# Patient Record
Sex: Female | Born: 1965 | Race: White | Hispanic: No | Marital: Married | Smoking: Never smoker
Health system: Southern US, Community
[De-identification: ages and names within clinical notes are randomized; demographics above are authoritative.]

## PROBLEM LIST (undated history)

## (undated) DIAGNOSIS — R030 Elevated blood-pressure reading, without diagnosis of hypertension: Secondary | ICD-10-CM

## (undated) DIAGNOSIS — F329 Major depressive disorder, single episode, unspecified: Secondary | ICD-10-CM

## (undated) DIAGNOSIS — F419 Anxiety disorder, unspecified: Secondary | ICD-10-CM

## (undated) DIAGNOSIS — F32A Depression, unspecified: Secondary | ICD-10-CM

## (undated) DIAGNOSIS — K219 Gastro-esophageal reflux disease without esophagitis: Secondary | ICD-10-CM

## (undated) DIAGNOSIS — G5760 Lesion of plantar nerve, unspecified lower limb: Secondary | ICD-10-CM

## (undated) DIAGNOSIS — E785 Hyperlipidemia, unspecified: Secondary | ICD-10-CM

## (undated) DIAGNOSIS — R002 Palpitations: Secondary | ICD-10-CM

## (undated) DIAGNOSIS — IMO0001 Reserved for inherently not codable concepts without codable children: Secondary | ICD-10-CM

## (undated) DIAGNOSIS — E291 Testicular hypofunction: Secondary | ICD-10-CM

## (undated) DIAGNOSIS — E538 Deficiency of other specified B group vitamins: Secondary | ICD-10-CM

## (undated) HISTORY — DX: Gastro-esophageal reflux disease without esophagitis: K21.9

## (undated) HISTORY — DX: Major depressive disorder, single episode, unspecified: F32.9

## (undated) HISTORY — DX: Lesion of plantar nerve, unspecified lower limb: G57.60

## (undated) HISTORY — DX: Elevated blood-pressure reading, without diagnosis of hypertension: R03.0

## (undated) HISTORY — DX: Deficiency of other specified B group vitamins: E53.8

## (undated) HISTORY — DX: Palpitations: R00.2

## (undated) HISTORY — DX: Hyperlipidemia, unspecified: E78.5

## (undated) HISTORY — DX: Testicular hypofunction: E29.1

## (undated) HISTORY — DX: Anxiety disorder, unspecified: F41.9

## (undated) HISTORY — DX: Reserved for inherently not codable concepts without codable children: IMO0001

## (undated) HISTORY — DX: Depression, unspecified: F32.A

## (undated) HISTORY — PX: TUBAL LIGATION: SHX77

---

## 2002-06-19 ENCOUNTER — Other Ambulatory Visit: Admission: RE | Admit: 2002-06-19 | Discharge: 2002-06-19 | Payer: Self-pay | Admitting: Obstetrics and Gynecology

## 2003-07-21 ENCOUNTER — Other Ambulatory Visit: Admission: RE | Admit: 2003-07-21 | Discharge: 2003-07-21 | Payer: Self-pay | Admitting: Obstetrics and Gynecology

## 2004-09-02 ENCOUNTER — Other Ambulatory Visit: Admission: RE | Admit: 2004-09-02 | Discharge: 2004-09-02 | Payer: Self-pay | Admitting: Obstetrics and Gynecology

## 2007-11-03 LAB — CONVERTED CEMR LAB: Pap Smear: NORMAL

## 2008-05-20 ENCOUNTER — Ambulatory Visit: Payer: Self-pay | Admitting: Internal Medicine

## 2008-05-20 DIAGNOSIS — H538 Other visual disturbances: Secondary | ICD-10-CM | POA: Insufficient documentation

## 2008-05-20 DIAGNOSIS — I499 Cardiac arrhythmia, unspecified: Secondary | ICD-10-CM | POA: Insufficient documentation

## 2008-05-20 DIAGNOSIS — Z9189 Other specified personal risk factors, not elsewhere classified: Secondary | ICD-10-CM | POA: Insufficient documentation

## 2008-05-20 DIAGNOSIS — E291 Testicular hypofunction: Secondary | ICD-10-CM | POA: Insufficient documentation

## 2008-05-20 DIAGNOSIS — R209 Unspecified disturbances of skin sensation: Secondary | ICD-10-CM | POA: Insufficient documentation

## 2008-08-24 ENCOUNTER — Ambulatory Visit: Payer: Self-pay | Admitting: Internal Medicine

## 2008-08-24 DIAGNOSIS — F32A Depression, unspecified: Secondary | ICD-10-CM | POA: Insufficient documentation

## 2008-08-24 DIAGNOSIS — E785 Hyperlipidemia, unspecified: Secondary | ICD-10-CM | POA: Insufficient documentation

## 2008-08-24 DIAGNOSIS — K219 Gastro-esophageal reflux disease without esophagitis: Secondary | ICD-10-CM | POA: Insufficient documentation

## 2008-08-24 DIAGNOSIS — R5381 Other malaise: Secondary | ICD-10-CM | POA: Insufficient documentation

## 2008-08-24 DIAGNOSIS — R5383 Other fatigue: Secondary | ICD-10-CM

## 2008-08-24 DIAGNOSIS — F329 Major depressive disorder, single episode, unspecified: Secondary | ICD-10-CM

## 2008-08-24 LAB — CONVERTED CEMR LAB
BUN: 15 mg/dL (ref 6–23)
Basophils Absolute: 0 10*3/uL (ref 0.0–0.1)
Basophils Relative: 0.2 % (ref 0.0–3.0)
CO2: 30 meq/L (ref 19–32)
Calcium: 9.2 mg/dL (ref 8.4–10.5)
Chloride: 108 meq/L (ref 96–112)
Cholesterol: 162 mg/dL (ref 0–200)
Creatinine, Ser: 0.8 mg/dL (ref 0.4–1.2)
Eosinophils Absolute: 0.1 10*3/uL (ref 0.0–0.7)
Eosinophils Relative: 1.9 % (ref 0.0–5.0)
GFR calc non Af Amer: 82.64 mL/min (ref >60)
Glucose, Bld: 94 mg/dL (ref 70–99)
HCT: 39.9 % (ref 36.0–46.0)
HDL: 32.5 mg/dL — ABNORMAL LOW (ref >39.00)
Hemoglobin: 14.1 g/dL (ref 12.0–15.0)
LDL Cholesterol: 109 mg/dL — ABNORMAL HIGH (ref 0–99)
Lymphocytes Relative: 27.3 % (ref 12.0–46.0)
Lymphs Abs: 1.4 10*3/uL (ref 0.7–4.0)
MCHC: 35.4 g/dL (ref 30.0–36.0)
MCV: 89.5 fL (ref 78.0–100.0)
Monocytes Absolute: 0.4 10*3/uL (ref 0.1–1.0)
Monocytes Relative: 8.6 % (ref 3.0–12.0)
Neutro Abs: 3.2 10*3/uL (ref 1.4–7.7)
Neutrophils Relative %: 62 % (ref 43.0–77.0)
Platelets: 209 10*3/uL (ref 150.0–400.0)
Potassium: 3.9 meq/L (ref 3.5–5.1)
RBC: 4.45 M/uL (ref 3.87–5.11)
RDW: 12 % (ref 11.5–14.6)
Sodium: 141 meq/L (ref 135–145)
TSH: 1.85 microintl units/mL (ref 0.35–5.50)
Total CHOL/HDL Ratio: 5
Triglycerides: 102 mg/dL (ref 0.0–149.0)
VLDL: 20.4 mg/dL (ref 0.0–40.0)
WBC: 5.1 10*3/uL (ref 4.5–10.5)

## 2008-08-25 ENCOUNTER — Telehealth: Payer: Self-pay | Admitting: Internal Medicine

## 2009-11-09 ENCOUNTER — Ambulatory Visit: Payer: Self-pay | Admitting: Internal Medicine

## 2009-11-09 LAB — CONVERTED CEMR LAB
ALT: 20 units/L (ref 0–35)
AST: 17 units/L (ref 0–37)
Albumin: 4.1 g/dL (ref 3.5–5.2)
Alkaline Phosphatase: 55 units/L (ref 39–117)
BUN: 12 mg/dL (ref 6–23)
Basophils Absolute: 0 10*3/uL (ref 0.0–0.1)
Basophils Relative: 0.6 % (ref 0.0–3.0)
Bilirubin Urine: NEGATIVE
Bilirubin, Direct: 0.1 mg/dL (ref 0.0–0.3)
CO2: 28 meq/L (ref 19–32)
Calcium: 9.5 mg/dL (ref 8.4–10.5)
Chloride: 106 meq/L (ref 96–112)
Cholesterol: 158 mg/dL (ref 0–200)
Creatinine, Ser: 0.7 mg/dL (ref 0.4–1.2)
Eosinophils Absolute: 0.1 10*3/uL (ref 0.0–0.7)
Eosinophils Relative: 1.7 % (ref 0.0–5.0)
GFR calc non Af Amer: 92.81 mL/min (ref >60)
Glucose, Bld: 83 mg/dL (ref 70–99)
HCT: 38.3 % (ref 36.0–46.0)
HDL: 36.4 mg/dL — ABNORMAL LOW (ref >39.00)
Hemoglobin: 13.3 g/dL (ref 12.0–15.0)
Ketones, ur: NEGATIVE mg/dL
LDL Cholesterol: 102 mg/dL — ABNORMAL HIGH (ref 0–99)
Leukocytes, UA: NEGATIVE
Lymphocytes Relative: 26.7 % (ref 12.0–46.0)
Lymphs Abs: 1.5 10*3/uL (ref 0.7–4.0)
MCHC: 34.5 g/dL (ref 30.0–36.0)
MCV: 91.3 fL (ref 78.0–100.0)
Monocytes Absolute: 0.5 10*3/uL (ref 0.1–1.0)
Monocytes Relative: 8.1 % (ref 3.0–12.0)
Neutro Abs: 3.5 10*3/uL (ref 1.4–7.7)
Neutrophils Relative %: 62.9 % (ref 43.0–77.0)
Nitrite: NEGATIVE
Platelets: 232 10*3/uL (ref 150.0–400.0)
Potassium: 4.2 meq/L (ref 3.5–5.1)
RBC: 4.2 M/uL (ref 3.87–5.11)
RDW: 12.9 % (ref 11.5–14.6)
Sodium: 141 meq/L (ref 135–145)
Specific Gravity, Urine: 1.025 (ref 1.000–1.030)
TSH: 1.85 microintl units/mL (ref 0.35–5.50)
Total Bilirubin: 0.6 mg/dL (ref 0.3–1.2)
Total CHOL/HDL Ratio: 4
Total Protein, Urine: NEGATIVE mg/dL
Total Protein: 6.5 g/dL (ref 6.0–8.3)
Triglycerides: 97 mg/dL (ref 0.0–149.0)
Urine Glucose: NEGATIVE mg/dL
Urobilinogen, UA: 0.2 (ref 0.0–1.0)
VLDL: 19.4 mg/dL (ref 0.0–40.0)
WBC: 5.6 10*3/uL (ref 4.5–10.5)
pH: 6 (ref 5.0–8.0)

## 2009-11-16 ENCOUNTER — Encounter: Payer: Self-pay | Admitting: Internal Medicine

## 2009-11-16 ENCOUNTER — Ambulatory Visit: Payer: Self-pay | Admitting: Internal Medicine

## 2009-11-16 DIAGNOSIS — G5760 Lesion of plantar nerve, unspecified lower limb: Secondary | ICD-10-CM | POA: Insufficient documentation

## 2009-11-16 LAB — HM MAMMOGRAPHY: HM Mammogram: NORMAL

## 2009-12-28 ENCOUNTER — Ambulatory Visit: Payer: Self-pay | Admitting: Internal Medicine

## 2010-01-07 ENCOUNTER — Telehealth: Payer: Self-pay | Admitting: Internal Medicine

## 2010-02-01 NOTE — Assessment & Plan Note (Signed)
Summary: CPX / NWS  #   Vital Signs:  Patient profile:   45 year old female Menstrual status:  regular Height:      68 inches (172.72 cm) Weight:      195.6 pounds (88.91 kg) BMI:     29.85 O2 Sat:      97 % on Room air Temp:     98.4 degrees F (36.89 degrees C) oral Pulse rate:   95 / minute BP sitting:   100 / 76  (left arm) Cuff size:   regular  Vitals Entered By: Orlan Leavens RMA (November 16, 2009 8:17 AM)  O2 Flow:  Room air CC: CPX Is Patient Diabetic? No Pain Assessment Patient in pain? no      Comments Req refill on Lexapro   Primary Care Provider:  Newt Lukes MD  CC:  CPX.  History of Present Illness: patient is here today for annual physical. Patient feels well.   c/o toe numbness/pain on bottom of left foot between great and 2nd toe - only with walking exertion - gone with rest - no injury or swelling  reviewed prior medical issues as well: depression - fatigue symptoms well controlled but experienceing more depressed libido since stopping testosterone injections - ?alt med with less SE  GERD - reports compliance with ongoing medical treatment and no changes in medication dose or frequency. denies adverse side effects related to current therapy.   Preventive Screening-Counseling & Management  Alcohol-Tobacco     Alcohol drinks/day: <1     Smoking Status: never     Tobacco Counseling: not indicated; no tobacco use  Caffeine-Diet-Exercise     Does Patient Exercise: yes     Exercise Counseling: not indicated; exercise is adequate     Depression Counseling: not indicated; screening negative for depression  Safety-Violence-Falls     Seat Belt Counseling: not indicated; patient wears seat belts     Helmet Counseling: not applicable     Violence Counseling: not applicable  Clinical Review Panels:  Prevention   Last Mammogram:  normal (11/03/2007)   Last Pap Smear:  normal (11/03/2007)  Immunizations   Last Tetanus Booster:  Tdap  (11/16/2009)   Last Flu Vaccine:  [facility]  (10/16/2009)  Lipid Management   Cholesterol:  158 (11/09/2009)   LDL (bad choesterol):  102 (11/09/2009)   HDL (good cholesterol):  36.40 (11/09/2009)  CBC   WBC:  5.6 (11/09/2009)   RBC:  4.20 (11/09/2009)   Hgb:  13.3 (11/09/2009)   Hct:  38.3 (11/09/2009)   Platelets:  232.0 (11/09/2009)   MCV  91.3 (11/09/2009)   MCHC  34.5 (11/09/2009)   RDW  12.9 (11/09/2009)   PMN:  62.9 (11/09/2009)   Lymphs:  26.7 (11/09/2009)   Monos:  8.1 (11/09/2009)   Eosinophils:  1.7 (11/09/2009)   Basophil:  0.6 (11/09/2009)  Complete Metabolic Panel   Glucose:  83 (11/09/2009)   Sodium:  141 (11/09/2009)   Potassium:  4.2 (11/09/2009)   Chloride:  106 (11/09/2009)   CO2:  28 (11/09/2009)   BUN:  12 (11/09/2009)   Creatinine:  0.7 (11/09/2009)   Albumin:  4.1 (11/09/2009)   Total Protein:  6.5 (11/09/2009)   Calcium:  9.5 (11/09/2009)   Total Bili:  0.6 (11/09/2009)   Alk Phos:  55 (11/09/2009)   SGPT (ALT):  20 (11/09/2009)   SGOT (AST):  17 (11/09/2009)   Current Medications (verified): 1)  Lexapro 5 Mg Tabs (Escitalopram Oxalate) .... Take 1 By  Mouth Qd 2)  Prilosec Otc 20 Mg Tbec (Omeprazole Magnesium) .... Take 1 By Mouth Once Daily  Allergies (verified): No Known Drug Allergies  Past History:  Past medical, surgical, family and social histories (including risk factors) reviewed, and no changes noted (except as noted below).  Past Medical History: cardiac arrhythmia - negative Holter and cardiac workup -[place] ?2003 history of elevated blood pressure, but no hypertension dx/tx GERD depression  MD roster: gyn - silva  Past Surgical History: Reviewed history from 05/20/2008 and no changes required. none  Family History: Reviewed history from 05/20/2008 and no changes required. Family History Hypertension (parent & grandparent)  Social History: Reviewed history from 05/20/2008 and no changes  required. Never Smoked, occ alcohol married, lives with spouse works as Diplomatic Services operational officer part time Does Patient Exercise:  yes  Review of Systems       see HPI above. I have reviewed all other systems and they were negative.   Physical Exam  General:  slightly overweight, but alert, well-developed, well-nourished, and cooperative to examination.    Head:  Normocephalic and atraumatic without obvious abnormalities. No apparent alopecia or balding. Eyes:  vision grossly intact; pupils equal, round and reactive to light.  conjunctiva and lids normal.    Ears:  normal pinnae bilaterally, without erythema, swelling, or tenderness to palpation. TMs clear, without effusion, or cerumen impaction. Hearing grossly normal bilaterally  Mouth:  teeth and gums in good repair; mucous membranes moist, without lesions or ulcers. oropharynx clear without exudate, no erythema.  Neck:  supple, full ROM, no masses, no thyromegaly; no thyroid nodules or tenderness. no JVD or carotid bruits.   Lungs:  normal respiratory effort, no intercostal retractions or use of accessory muscles; normal breath sounds bilaterally - no crackles and no wheezes.    Heart:  normal rate, regular rhythm, no murmur, and no rub. BLE without edema Abdomen:  soft, non-tender, normal bowel sounds, no distention; no masses and no appreciable hepatomegaly or splenomegaly.   Genitalia:  defer gyn Msk:  No deformity or scoliosis noted of thoracic or lumbar spine.  left foot normal - no reproducible pain with palp or deformity Neurologic:  alert & oriented X3 and cranial nerves II-XII symetrically intact.  strength normal in all extremities, sensation intact to light touch, and gait normal. speech fluent without dysarthria or aphasia; follows commands with good comprehension.  Skin:  no rashes, vesicles, ulcers, or erythema. No nodules or irregularity to palpation.  Psych:  Oriented X3, memory intact for recent and remote, normally interactive, good  eye contact, not anxious appearing, not depressed appearing, and not agitated.      Impression & Recommendations:  Problem # 1:  PREVENTIVE HEALTH CARE (ICD-V70.0) Patient has been counseled on age-appropriate routine health concerns for screening and prevention. These are reviewed and up-to-date. Immunizations are up-to-date or declined. Labs and ECG reviewed.  Orders: EKG w/ Interpretation (93000)  Problem # 2:  GERD (ICD-530.81) desribes occ esoph spasm pain - 1x/mo or less - if increasing spells, consider alt PPI - pt understands and agrees The following medications were removed from the medication list:    Prilosec 10 Mg Cpdr (Omeprazole) .Marland Kitchen... Take 1 by mouth qd Her updated medication list for this problem includes:    Prilosec Otc 20 Mg Tbec (Omeprazole magnesium) .Marland Kitchen... 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 1 by mouth once daily  Orders: Prescription Created Electronically [phone])  Complete Medication List: 1)  Wellbutrin Xl 150 Mg Xr24h-tab (Bupropion hcl) .Marland Kitchen.. 1 by mouth once daily 2)  Prilosec Otc 20 Mg Tbec (Omeprazole magnesium) .... Take 1 by mouth once daily  Other Orders: Tdap => 47yrs IM (09811) Admin 1st Vaccine (91478)  Patient Instructions: 1)  it was good to see you today. 2)  exam and EKG and labs look great - keep up the good work! 3)  keep followup as planned with Dr. Edward Jolly for PAP/pelvic 4)  stop lexapro and start wellbutrin - your prescription has been electronically submitted to your pharmacy. Please take as directed. Contact our office if you believe you're having problems with the medication(s).  5)  Please  schedule a follow-up appointment in 6 weeks to review this medication change, call sooner if problems.  6)  Please schedule a follow-up appointment in 1 year for annual medical physical and labs. Prescriptions: WELLBUTRIN XL 150 MG XR24H-TAB (BUPROPION HCL) 1 by mouth once daily  #30 x 6   Entered and Authorized by:   Newt Lukes MD   Signed by:   Newt Lukes MD on 11/16/2009   Method used:   Electronically to        [facility]* (retail)       [address]       [place], [place]  [address]       Ph: [phone]       Fax: [phone]   RxID:   [id number]    Orders Added: 1)  Tdap => 47yrs IM [90715] 2)  Admin 1st Vaccine [90471] 3)  EKG w/ Interpretation [93000] 4)  Est. Patient 40-64 years [99396] 5)  Est. Patient Level III [34742] 6)  Prescription Created Electronically [[id number]]   Immunization History:  Influenza Immunization History:    Influenza:  [facility]  (10/16/2009)  Immunizations Administered:  Tetanus Vaccine:    Vaccine Type: Tdap    Site: right deltoid    Mfr: GlaxoSmithKline    Dose: 0.5 ml    Route: IM    Given by: Orlan Leavens RMA    Exp. Date: 10/22/2011    Lot #: [id number]    VIS given: 11/16/09   Immunization History:  Influenza Immunization History:    Influenza:  [facility]  (10/16/2009)  Immunizations Administered:  Tetanus Vaccine:    Vaccine Type: Tdap    Site: right deltoid    Mfr: GlaxoSmithKline    Dose: 0.5 ml    Route: IM    Given by: Orlan Leavens RMA    Exp. Date: 10/22/2011    Lot #: [id number]    VIS given: 11/16/09

## 2010-02-01 NOTE — Assessment & Plan Note (Signed)
Summary: WEAK TIRED X 1 WK OR SO--$50--STC   Vital Signs:  Patient profile:   45 year old female Menstrual status:  regular Height:      68 inches (172.72 cm) Weight:      194.4 pounds (88.36 kg) O2 Sat:      97 % Temp:     98.5 degrees F (36.94 degrees C) oral Pulse rate:   85 / minute BP sitting:   118 / 88  (left arm) Cuff size:   regular  Vitals Entered By: Orlan Leavens (August 24, 2008 8:41 AM) CC: Weak/ Fatigue x's 1 week Is Patient Diabetic? No Pain Assessment Patient in pain? no        Primary Care Provider:  Newt Lukes MD  CC:  Weak/ Fatigue x's 1 week.  History of Present Illness:  Fatigue      This is a 45 year old woman who presents with Fatigue.  The symptoms began 2 weeks ago.  The severity is described as moderate.  The patient reports persistent fatigue, fatigue with minimal exertion, and primarily physical fatigue, but denies primarily motivational fatigue.  The patient denies fever, night sweats, weight loss, exertional chest pain, dyspnea, cough, and new medications.  The patient denies the following symptoms: leg swelling, melena, adenopathy, daytime sleepiness, and skin changes.  The patient denies feeling depressed, altered appetite, and poor sleep.    also c/o "sour mouth" x3-4 weeks no reflux of chest pain no hx dx GERD but taking OTC PPI as needed symptoms  some inporvemnt "sour taste" with OTC PPI no throat pain no hoarseness  Current Medications (verified): 1)  Depo-Testosterone 100 Mg/ml Oil (Testosterone Cypionate) .... Take 1 Injection Q 6 Weeks 2)  Prilosec 10 Mg Cpdr (Omeprazole) .... Take 1 By Mouth Qd  Allergies (verified): No Known Drug Allergies PMH-FH-SH reviewed-no changes except otherwise noted  Review of Systems  The patient denies anorexia, fever, hoarseness, chest pain, syncope, dyspnea on exertion, peripheral edema, headaches, abdominal pain, hematochezia, and severe indigestion/heartburn.    Physical  Exam  General:  slightly overweight, but alert, well-developed, well-nourished, and cooperative to examination.    Mouth:  teeth and gums in good repair; mucous membranes moist, without lesions or ulcers. oropharynx clear without exudate, erythema.  Lungs:  normal respiratory effort, no intercostal retractions or use of accessory muscles; normal breath sounds bilaterally - no crackles and no wheezes.    Heart:  normal rate, regular rhythm, no murmur, and no rub. BLE without edema.  Skin:  no pallor or rash Psych:  Oriented X3, memory intact for recent and remote, normally interactive, good eye contact, not anxious appearing, not depressed appearing, and not agitated.      Impression & Recommendations:  Problem # 1:  FATIGUE (ICD-780.79) ongoing symptoms despite changes in weather and menses no assoc hx to suggest malignant cause at this point labs today to r/o underlying medical causes consider tx for depression if normal labs given hx similar symptoms with good improvment while on lexapro d/w pt who agrees Orders: TLB-CBC Platelet - w/Differential (85025-CBCD) TLB-TSH (Thyroid Stimulating Hormone) (84443-TSH) TLB-BMP (Basic Metabolic Panel-BMET) (80048-METABOL)  Problem # 2:  GERD (ICD-530.81) likely cause of "sour" symptoms  cont daily OTC PPI not just as needed f/u symptoms next visit, sooner if conty problems Her updated medication list for this problem includes:    Prilosec 10 Mg Cpdr (Omeprazole) .Marland Kitchen... Take 1 by mouth qd  Problem # 3:  HYPERLIPIDEMIA, MILD (ICD-272.4) hx same -  fastiong today - will check FLP at pt request Orders: TLB-Lipid Panel (80061-LIPID)  Complete Medication List: 1)  Depo-testosterone 100 Mg/ml Oil (Testosterone cypionate) .... Take 1 injection q 6 weeks 2)  Prilosec 10 Mg Cpdr (Omeprazole) .... Take 1 by mouth qd  Patient Instructions: 1)  Labs today to look for medical causes of fatigue and check cholesterol - 2)  I will call you next 48h with  these results to discuss and make other plans for treatment as needed 3)  continue omeprazole daily for 7-14days for refulx symptoms , not just "as needed" 4)  Please schedule a follow-up appointment in 6-12 months, sooner if needed for problems.

## 2010-02-01 NOTE — Assessment & Plan Note (Signed)
Summary: NEW TO ESTABLISH/BC[employer]/$50/CD   Vital Signs:  Patient profile:   45 year old female Menstrual status:  regular LMP:     04/27/2008 Height:      68 inches (172.72 cm) Weight:      197.8 pounds (89.91 kg) BMI:     30.18 O2 Sat:      98 % Temp:     98.7 degrees F (37.06 degrees C) oral Pulse rate:   108 / minute BP sitting:   126 / 84  (left arm) Cuff size:   regular  Vitals Entered By: Orlan Leavens (May 20, 2008 9:34 AM)  Nutrition Counseling: Patient's BMI is greater than 25 and therefore counseled on weight management options. CC: New patient est Is Patient Diabetic? No Pain Assessment Patient in pain? no      LMP (date): 04/27/2008  years   days  Menstrual Status regular Enter LMP: 04/27/2008 Last PAP Result normal   Primary Care Provider:  Newt Lukes MD  CC:  New patient est.  History of Present Illness: 45 yo WF, former patient of Dr. Lorenz Coaster, here today to establish care  Takes testosterone injections through her gynecologist office every 6 weeks to help with libido.  has been on treatment  approximately 6 months. Improvement in symptoms include better sexual libido.  no adverse SE have ben notes  concerned re: blurring vision wears glasses but no eye checkup/prescription change in over 4 years notes sxs most prominently when working on the computer and then needing to look away. Described as blurring sensation that takes several seconds to refocus Does not have bifocals No headache No eye discharge. No eye pain  Also has chronic history of arrhythmia. She describes these as palpitations and flutter Previous workup has included weeklong event monitor that showed no abnormalities (?2003) She denies chest pain or shortness of breath. No change in her chronic symptoms at this time  occasional numbness and "tightness" feeling in her cheeks bilaterally. Onset of symptoms are associated with the end of her menstrual cycle No history of  migraines. No difficulty speaking. No jaw pain or claudication symptoms with chewing.  Preventive Screening-Counseling & Management     Smoking Status: never  Current Medications (verified): 1)  Depo-Testosterone 100 Mg/ml Oil (Testosterone Cypionate) .... Take 1 Injection Q 6 Weeks  Allergies (verified): No Known Drug Allergies  Comments:  Nurse/Medical Assistant: The patient's medications and allergies were reviewed with the patient and were updated in the Medication and Allergy Lists. Valentina Gu Brand (May 20, 2008 9:35 AM)  Past History:  Past Medical History:    cardiac arrhythmia - negative Holter and cardiac workup -[place] ?2003    Blood in stool    history of elevated blood pressure, but no hypertension dx or treatment  Past Surgical History:    none  Family History:    Family History Hypertension (parent & grandparent)  Social History:    Never Smoked    married    works as Diplomatic Services operational officer    Smoking Status:  never  Review of Systems       see HPI above. I have reviewed all other systems and they were negative.   Physical Exam  General:  slightly overweight, but alert, well-developed, well-nourished, and cooperative to examination.    Head:  Normocephalic and atraumatic without obvious abnormalities. No apparent alopecia or balding. Eyes:  vision grossly intact; pupils equal, round and reactive to light.  conjunctiva and lids normal.   wearing corrective lenses Ears:  normal pinnae bilaterally, without erythema, swelling, or tenderness to palpation. TMs clear, without effusion, or cerumen impaction. Hearing grossly normal bilaterally  Mouth:  teeth and gums in good repair; mucous membranes moist, without lesions or ulcers. oropharynx clear without exudate, erythema.  Lungs:  normal respiratory effort, no intercostal retractions or use of accessory muscles; normal breath sounds bilaterally - no crackles and no wheezes.    Heart:  normal rate, regular rhythm, no murmur,  and no rub. BLE without edema. normal DP pulses and normal cap refill in all 4 extremities    Msk:  no joint effusions or deformities Neurologic:  alert & oriented X3 and cranial nerves II-XII symetrically intact.  strength normal in all extremities, sensation intact to light touch, and gait normal. speech fluent without dysarthria or aphasia;  follows commands with good comprehension.  Skin:  no rashes vesicles, ulcers, or erythema. No nodules or irregularity to palpation.  Psych:  Oriented X3, memory intact for recent and remote, normally interactive, good eye contact, not anxious appearing, not depressed appearing, and not agitated.      Impression & Recommendations:  Problem # 1:  BLURRED VISION (ICD-368.8) no abnormality is appreciated on exam. Recommended followup for corrective lens prescription adjustment. (She has this scheduled for today) If continued symptoms of blurring 4-6 weeks after this prescription adjustment, will reeval  Problem # 2:  CARDIAC ARRHYTHMIA (ICD-427.9) chronic history of palpitations with negative cardiac workup in the past Also reports abnormal lab work including thyroid in the past Patient instructed that if she has change in symptoms, she should contact us for reevaluation  Problem # 3:  TESTOSTERONE DEFICIENCY (ICD-257.2) Continue treatment as ongoing with gynecologist. (dr. Edward Jolly)  Problem # 4:  DISTURBANCE OF SKIN SENSATION (ICD-782.0) bilateral face symptoms no neuro deficits appreciable on exam Possible relation to hormonal cycle discussed. if associated with pain, other facial weakness, headache or jaw, patient will contact us for reevaluation  Complete Medication List: 1)  Depo-testosterone 100 Mg/ml Oil (Testosterone cypionate) .... Take 1 injection q 6 weeks  Patient Instructions: 1)  It has been a pleasure meeting you today 2)  proceed with eye exam and prescription review as you as scheduled. If persisting blurring and vision changes 4-6  weeks after this is done, pPlease contact our office for further evaluation 3)  if you have changes in your arrhythmia symptoms, please contact our office for further evaluation 4)  Schedule for physical in 6 months (or as appropriate) for routine lab followup    Preventive Care Screening  Mammogram:    Date:  11/03/2007    Results:  normal   Pap Smear:    Date:  11/03/2007    Results:  normal

## 2010-02-01 NOTE — Progress Notes (Signed)
Summary: labs  Phone Note Call from Patient Call back at Home Phone [phone]   Caller: Patient Call For: Dr Trish Mage Summary of Call: Pt states she is home sick today from work and was told she would receive call this a.m with her lab results. Initial call taken by: Verdell Face,  August 25, 2008 8:26 AM  Follow-up for Phone Call        labs all normal - suggest starting Lexapro 5mg  daily for probable depression symptoms - ok to call in if pt agrees - thanks! Follow-up by: Newt Lukes MD,  August 25, 2008 10:24 AM  Additional Follow-up for Phone Call Additional follow up Details #1::        Notified pt, also sent rx to [facility] Additional Follow-up by: Orlan Leavens,  August 25, 2008 1:13 PM    New/Updated Medications: LEXAPRO 5 MG TABS (ESCITALOPRAM OXALATE) take 1 by mouth qd Prescriptions: LEXAPRO 5 MG TABS (ESCITALOPRAM OXALATE) take 1 by mouth qd  #30 x 6   Entered by:   Orlan Leavens   Authorized by:   Newt Lukes MD   Signed by:   Orlan Leavens on 08/25/2008   Method used:   Faxed to ...       [facility] (retail)       [address]       [place], [place]  [address]       Ph: [phone]       Fax: [phone]   RxID:   [id number]

## 2010-02-03 ENCOUNTER — Telehealth: Payer: Self-pay | Admitting: Internal Medicine

## 2010-02-03 NOTE — Assessment & Plan Note (Signed)
Summary: 6 WK FU---STC   Vital Signs:  Patient profile:   45 year old female Menstrual status:  regular Height:      68 inches (172.72 cm) Weight:      193.0 pounds (87.73 kg) O2 Sat:      97 % on Room air Temp:     98.4 degrees F (36.89 degrees C) oral Pulse rate:   99 / minute BP sitting:   110 / 78  (left arm) Cuff size:   regular  Vitals Entered By: Orlan Leavens RMA (December 28, 2009 8:29 AM)  O2 Flow:  Room air CC: 6 week follow-up Is Patient Diabetic? No Pain Assessment Patient in pain? no        Primary Care Provider:  Newt Lukes MD  CC:  6 week follow-up.  History of Present Illness: here for f/u  depression - changed lexapro to wellbutrin 11/16/09 but only on med approx 3 weeks - reports compliance with ongoing medical treatment and no changes in medication dose or frequency. denies adverse side effects related to current therapy.  fatigue symptoms improved and less libido suppression but ?more anxious and nervous than when on lexapro  GERD - reports compliance with ongoing medical treatment and no changes in medication dose or frequency. denies adverse side effects related to current therapy.   c/o toe numbness/pain on bottom of left foot between great and 2nd toe - only with walking exertion - gone with rest - no injury or swelling  Clinical Review Panels:  CBC   WBC:  5.6 (11/09/2009)   RBC:  4.20 (11/09/2009)   Hgb:  13.3 (11/09/2009)   Hct:  38.3 (11/09/2009)   Platelets:  232.0 (11/09/2009)   MCV  91.3 (11/09/2009)   MCHC  34.5 (11/09/2009)   RDW  12.9 (11/09/2009)   PMN:  62.9 (11/09/2009)   Lymphs:  26.7 (11/09/2009)   Monos:  8.1 (11/09/2009)   Eosinophils:  1.7 (11/09/2009)   Basophil:  0.6 (11/09/2009)   Current Medications (verified): 1)  Wellbutrin Xl 150 Mg Xr24h-Tab (Bupropion Hcl) .Marland Kitchen.. 1 By Mouth Once Daily 2)  Prilosec Otc 20 Mg Tbec (Omeprazole Magnesium) .... Take 1 By Mouth Once Daily  Allergies (verified): No Known  Drug Allergies  Past History:  Past Medical History: cardiac arrhythmia - negative Holter and cardiac workup  -[place] ?2003 history of elevated blood pressure, but no hypertension dx/tx GERD depression  MD roster:  gyn - silva  Review of Systems  The patient denies fever, chest pain, and headaches.    Physical Exam  General:  slightly overweight, but alert, well-developed, well-nourished, and cooperative to examination.    Lungs:  normal respiratory effort, no intercostal retractions or use of accessory muscles; normal breath sounds bilaterally - no crackles and no wheezes.    Heart:  normal rate, regular rhythm, no murmur, and no rub. BLE without edema Psych:  Oriented X3, memory intact for recent and remote, normally interactive, good eye contact, not anxious appearing, not depressed appearing, and not agitated.      Impression & Recommendations:  Problem # 1:  DEPRESSION (ICD-311) s/p change ;lexapro to wellbutrin - only on med x 3 weeks - change during holiday and end of year stress - son nearing deployment - after discussion - give full8 week adjustment time on new med and provide low dose xanax as needed - rx done today f/u 6 weeks, sooner if probs - pt agrees Her updated medication list for this problem includes:  Wellbutrin Xl 150 Mg Xr24h-tab (Bupropion hcl) .Marland Kitchen... 1 by mouth once daily    Alprazolam 0.25 Mg Tabs (Alprazolam) .Marland Kitchen... 1 by mouth every 8 hours as needed for anxiety symptoms  Complete Medication List: 1)  Wellbutrin Xl 150 Mg Xr24h-tab (Bupropion hcl) .Marland Kitchen.. 1 by mouth once daily 2)  Prilosec Otc 20 Mg Tbec (Omeprazole magnesium) .... Take 1 by mouth once daily 3)  Alprazolam 0.25 Mg Tabs (Alprazolam) .Marland Kitchen.. 1 by mouth every 8 hours as needed for anxiety symptoms  Patient Instructions: 1)  it was good to see you today. 2)  continue wellbutrin - and use low dose generic xanxa if or as needed - your prescription has been provided to you to give to your  pharmacy. Please take as directed. Contact our office if you believe you're having problems with the medication(s).  3)  Please schedule a follow-up appointment in 6 weeks to review this medication change, call sooner if problems.  Prescriptions: ALPRAZOLAM 0.25 MG TABS (ALPRAZOLAM) 1 by mouth every 8 hours as needed for anxiety symptoms  #30 x 0   Entered and Authorized by:   Newt Lukes MD   Signed by:   Newt Lukes MD on 12/28/2009   Method used:   Print then Give to Patient   RxID:   [id number]    Orders Added: 1)  Est. Patient Level IV [56213]

## 2010-02-03 NOTE — Progress Notes (Signed)
Summary: Alyssa Pratt  Phone Note Call from Patient Call back at Home Phone [phone]   Caller: Patient [phone] Summary of Call: Pt called stating she has pink eye but is currently at [facility] with her husband who is having surgery today and will be released on Sunday. Pt will not be able to come in for OV until Monday but her spouse is having eye surgery and she is afraid he will catch her pink eye. Pt is requesting ABX eye drops to [facility] Initial call taken by: Dahlia Bonyun-DeBourgh, CMA,  January 07, 2010 8:56 AM  Follow-up for Phone Call        ok - erx for polytrim drops - OV if unimproved/worse Follow-up by: Jalayla Chrismer A Kayleah Appleyard MD,  January 07, 2010 10:03 AM  Additional Follow-up for Phone Call Additional follow up Details #1::        Pt advised of Rx Additional Follow-up by: Dahlia Bonyun-DeBourgh, CMA,  January 07, 2010 10:18 AM    New/Updated Medications: POLYTRIM 10000-0.1 UNIT/ML-%  SOLN (POLYMYXIN B-TRIMETHOPRIM) 1 drop in affected eye every 4 hours x 5days Prescriptions: POLYTRIM 10000-0.1 UNIT/ML-%  SOLN (POLYMYXIN B-TRIMETHOPRIM) 1 drop in affected eye every 4 hours x 5days  #1 x 0   Entered and Authorized by:   Jaemarie Hochberg A Karol Skarzynski MD   Signed by:   Daimion Adamcik A Beanca Kiester MD on 01/07/2010   Method used:   Electronically to        [facility]* (retail)       [address]       [place], [place]  [address]       Ph: [phone]       Fax: [phone]   RxID:   [id number]

## 2010-02-08 ENCOUNTER — Ambulatory Visit: Payer: Self-pay | Admitting: Internal Medicine

## 2010-02-08 ENCOUNTER — Ambulatory Visit (INDEPENDENT_AMBULATORY_CARE_PROVIDER_SITE_OTHER): Payer: BC Managed Care – PPO | Admitting: Cardiovascular Disease

## 2010-02-08 DIAGNOSIS — R002 Palpitations: Secondary | ICD-10-CM

## 2010-02-08 NOTE — Letter (Signed)
February 08, 2010   Dr. Gillis Ends [facility] [address] [place] [address], [place] [address]  RE:  Alyssa, Pratt MRN:  [id number]  /  DOB:  04-01-1965  Dear Dr. Manson Passey,  PRIMARY CARE PHYSICIAN:  Raenette Rover. Felicity Coyer, MD  Thank you for referring Alyssa Pratt for further cardiac evaluation.  As you are aware, this is a pleasant 45 year old female with the following problem list: 1. Palpitations. 2. Anxiety. 3. Gastroesophageal reflux disease.  HISTORY OF PRESENT ILLNESS:  Alyssa Pratt is here today for evaluation of palpitations.  This is a longstanding problem and has been happening for a few years.  She was evaluated about 2 years ago according to the patient with monitoring as well as echocardiogram and a stress test. According to the patient, the workup was unremarkable.  Her symptoms worsened recently.  She had a prolonged episode a few weeks ago.  She describes mostly skipping in her heart and not necessarily tachycardia. She had an episode of chest pain also around that time which made her concerned.  It was a sharp chest discomfort that happened at rest and did not last for a long time.  Her palpitations can happen any time and sometimes are related to anxiety.  They are not related to physical exertion.  She has not had any other episodes of chest pain.  She denies any dyspnea.  There is no history of syncope or presyncope.  She does not have any thyroid disease and does not consume large amounts of caffeinated products.  There is no family history of arrhythmia.  MEDICATIONS: 1. Wellbutrin once daily. 2. Omeprazole 20 mg once daily.  ALLERGIES:  NO KNOWN DRUG ALLERGIES.  SOCIAL HISTORY:  Negative for smoking, alcohol or recreational drug use. She drinks one cup of coffee a day.  She exercises by walking 5-6 times per week.  She walks about 45 minutes at a time.  She works as an Environmental health practitioner.  She is married and has 3 kids.  PAST SURGICAL HISTORY:   C-section x3.  FAMILY HISTORY:  Negative for coronary artery disease or arrhythmia. There is no family history of sudden cardiac death.  REVIEW OF SYSTEMS:  This is remarkable for palpitations as outlined above.  There is also anxiety and stress.  A full review of system was performed and is otherwise negative.  PHYSICAL EXAMINATION:  GENERAL:  The patient appears to be at her stated age and no acute distress. VITAL SIGNS:  Weight is 190.2 pounds, blood pressure is 123/85, pulse is 88. HEENT:  Normocephalic, atraumatic. NECK:  No JVD or carotid bruits.  There is no thyromegaly. RESPIRATORY:  Normal respiratory effort with no use of accessory muscles.  Auscultation reveals normal breath sounds. CARDIOVASCULAR:  Normal PMI.  Normal S1, S2 with no gallops or murmurs. ABDOMEN:  Benign, nontender, nondistended. EXTREMITIES:  With no clubbing, cyanosis or edema. SKIN:  Warm and dry with no rash. PSYCHIATRIC:  She is alert, oriented x3 with normal mood and affect. MUSCULOSKELETAL:  There is normal muscle strength in the upper and lower extremities.  STUDIES:  An electrocardiogram was performed which showed normal sinus rhythm with no significant ST or T-wave changes.  QT interval is normal. PR interval is normal.  IMPRESSION:  Palpitations.  This is likely due to premature beats based on her history.  Her symptoms seems to have worsened recently.  She does complain that also she is relatively tachycardic most of the time.  Her thyroid function  was checked and it was normal.  Due to worsening of her symptoms, I recommend a 48-hour Holter monitor as well as a transthoracic echocardiogram to make sure she does not have any structural heart disease.  There is no need for a stress test at this time given that she has no symptoms suggestive of angina.  We will decide further treatment based on the results of her workup.  I discussed with her that she might actually benefit from small dose  beta blocker given her associated symptoms of anxiety which might be contributing.  We will obviously wait until after the cardiac workup. The patient will be notified with the results.  She will return for follow-up if her testing is abnormal.  Thank you for allowing me to participate in the care of your patient.   Sincerely,     Lorine Bears, MD Electronically Signed   MA/MedQ  DD: 02/08/2010  DT: 02/08/2010  Job #: [id number]

## 2010-02-09 NOTE — Progress Notes (Signed)
Summary: inreased HR/palps  Phone Note Call from Patient Call back at Work Phone [phone]   Caller: Patient [phone] Summary of Call: Pt called stating she has been experiencing increased HR and palpitations. I called pt back and she stated she was on her way to an UC near her job but she has an appt with VAL 02/07 and will discuss sxs and possible cardiology referral at that time. I agreed and pt will call back if needed. Initial call taken by: Margaret Pyle, CMA,  February 03, 2010 1:28 PM

## 2010-02-10 ENCOUNTER — Telehealth (INDEPENDENT_AMBULATORY_CARE_PROVIDER_SITE_OTHER): Payer: Self-pay | Admitting: *Deleted

## 2010-02-11 ENCOUNTER — Telehealth: Payer: Self-pay | Admitting: Cardiovascular Disease

## 2010-02-15 ENCOUNTER — Telehealth: Payer: Self-pay | Admitting: Cardiovascular Disease

## 2010-02-17 NOTE — Progress Notes (Signed)
----   Converted from flag ---- ---- 02/09/2010 5:57 PM, Era Bumpers wrote: ECHO at [facility]...[employer] ZOXW:[id number] exp:03/10/10 ------------------------------

## 2010-02-17 NOTE — Progress Notes (Signed)
Summary: Echo results  Phone Note Outgoing Call   Call placed by: Dessie Coma  LPN,  February 11, 2010 2:42 PM Call placed to: Patient Summary of Call: LMVM-notified patient per Dr. Kirke Corin, Echo is normal.  If any questions to call office.

## 2010-02-23 NOTE — Progress Notes (Signed)
Summary: holter results/LMTC  Phone Note Call from Patient   Caller: Patient Summary of Call: LMVM-for patient to call this nurse back re: holter monitor results.  Per Dr. Kirke Corin, monitor was normal.  If she is feeling better, no need for medication.  If she still feels bad, to give Toprol XL 25mg  once daily #30 with 4 refeills. To f/u in 2 months if medicated. Initial call taken by: Dessie Coma  LPN,  February 15, 2010 4:28 PM  Follow-up for Phone Call        Phone Call Completed:patient returned call-advised per Dr. Kirke Corin, monitor was normal.  No need for betablockers if not having any symptoms.  Patient states will just wait on medicating right now and will call us back if any more problems. Follow-up by: Dessie Coma  LPN,  February 16, 2010 10:51 AM

## 2010-04-04 LAB — HM COLONOSCOPY

## 2010-04-16 ENCOUNTER — Encounter: Payer: Self-pay | Admitting: Cardiovascular Disease

## 2010-05-09 ENCOUNTER — Encounter: Payer: Self-pay | Admitting: Internal Medicine

## 2010-05-09 ENCOUNTER — Ambulatory Visit (INDEPENDENT_AMBULATORY_CARE_PROVIDER_SITE_OTHER)
Admission: RE | Admit: 2010-05-09 | Discharge: 2010-05-09 | Disposition: A | Discharge status: Home / Self Care | Payer: BC Managed Care – PPO | Source: Ambulatory Visit | Attending: Internal Medicine | Admitting: Internal Medicine

## 2010-05-09 ENCOUNTER — Telehealth: Payer: Self-pay | Admitting: Internal Medicine

## 2010-05-09 ENCOUNTER — Other Ambulatory Visit (INDEPENDENT_AMBULATORY_CARE_PROVIDER_SITE_OTHER): Payer: BC Managed Care – PPO

## 2010-05-09 ENCOUNTER — Ambulatory Visit (INDEPENDENT_AMBULATORY_CARE_PROVIDER_SITE_OTHER): Payer: BC Managed Care – PPO | Admitting: Internal Medicine

## 2010-05-09 VITALS — BP 128/90 | HR 90 | Temp 97.7°F | Wt 183.0 lb

## 2010-05-09 DIAGNOSIS — R1011 Right upper quadrant pain: Secondary | ICD-10-CM

## 2010-05-09 DIAGNOSIS — R091 Pleurisy: Secondary | ICD-10-CM

## 2010-05-09 LAB — CBC WITH DIFFERENTIAL/PLATELET
Basophils Absolute: 0 10*3/uL (ref 0.0–0.1)
Basophils Relative: 0.4 % (ref 0.0–3.0)
Eosinophils Absolute: 0 10*3/uL (ref 0.0–0.7)
Eosinophils Relative: 0.4 % (ref 0.0–5.0)
HCT: 39.7 % (ref 36.0–46.0)
Hemoglobin: 13.9 g/dL (ref 12.0–15.0)
Lymphocytes Relative: 15.1 % (ref 12.0–46.0)
Lymphs Abs: 1.2 10*3/uL (ref 0.7–4.0)
MCHC: 34.8 g/dL (ref 30.0–36.0)
MCV: 90.8 fl (ref 78.0–100.0)
Monocytes Absolute: 0.4 10*3/uL (ref 0.1–1.0)
Monocytes Relative: 5.2 % (ref 3.0–12.0)
Neutro Abs: 6.5 10*3/uL (ref 1.4–7.7)
Neutrophils Relative %: 78.9 % — ABNORMAL HIGH (ref 43.0–77.0)
Platelets: 237 10*3/uL (ref 150.0–400.0)
RBC: 4.38 Mil/uL (ref 3.87–5.11)
RDW: 12.4 % (ref 11.5–14.6)
WBC: 8.2 10*3/uL (ref 4.5–10.5)

## 2010-05-09 LAB — HEPATIC FUNCTION PANEL
ALT: 15 U/L (ref 0–35)
AST: 14 U/L (ref 0–37)
Albumin: 4.5 g/dL (ref 3.5–5.2)
Alkaline Phosphatase: 57 U/L (ref 39–117)
Bilirubin, Direct: 0.1 mg/dL (ref 0.0–0.3)
Total Bilirubin: 0.6 mg/dL (ref 0.3–1.2)
Total Protein: 7.1 g/dL (ref 6.0–8.3)

## 2010-05-09 NOTE — Progress Notes (Signed)
Subjective:    Patient ID: Alyssa Pratt, female    DOB: 1965/02/05, 45 y.o.   MRN: [id number]  HPI   here for RUQ pain/R pleurisy discomfort -  Onset 4 weeks ago,  Pain described as mild-mod,  Pain exac by activity/exertion (walking for weight loss) - not positional or reproducible with palpation - no radiation of pain No back pain, no cough, SOB/DOE or sputum; no NS/fever, nausea and vomiting or bowel changes No trauma recalled Recent OP cardiac eval for palpitations unremarkable (reviewed 02/2010 holter results)   Also reviewed chronic medical issues:  Depression/anxiety - changed lexapro to wellbutrin 11/16/09 - reports compliance with ongoing medical treatment and no changes in medication dose or frequency. denies adverse side effects related to current therapy.  fatigue symptoms improved and less libido suppression but ?more anxious and nervous than when on lexapro  GERD - reports compliance with ongoing medical treatment and no changes in medication dose or frequency. denies adverse side effects related to current therapy.   Past Medical History  Diagnosis Date  . Palpitations     negative holter 02/2010  . GERD (gastroesophageal reflux disease)   . Anxiety     Review of Systems  Constitutional: Negative for diaphoresis and fatigue.  Respiratory: Negative for wheezing.   Cardiovascular: Negative for chest pain.  Genitourinary: Negative for dysuria and flank pain.  Musculoskeletal: Negative for back pain, joint swelling and gait problem.  Neurological: Negative for numbness.  Hematological: Does not bruise/bleed easily.       Objective:   Physical Exam BP 128/90  Pulse 90  Temp(Src) 97.7 F (36.5 C) (Oral)  Wt 183 lb (83.008 kg)  SpO2 97% Wt Readings from Last 3 Encounters:  05/09/10 183 lb (83.008 kg)  12/28/09 193 lb (87.544 kg)  11/16/09 195 lb 9.6 oz (88.724 kg)   Physical Exam  Constitutional: She is oriented to person, place, and time. She appears  well-developed and well-nourished. No distress.  Eyes: Conjunctivae and EOM are normal. Pupils are equal, round, and reactive to light. No scleral icterus.  Neck: Normal range of motion. Neck supple. No JVD present. No thyromegaly present.  Cardiovascular: Normal rate, regular rhythm and normal heart sounds.  No murmur heard. Pulmonary/Chest: Effort normal and breath sounds normal. No respiratory distress. She has no wheezes.  Abdominal: Soft. Bowel sounds are normal. She exhibits no distension. There is no tenderness.   Skin: Skin is warm and dry. No rash noted. No erythema. no shingles or bruising along R dermatome of symptoms  Psychiatric: She has a normal mood and affect. Her behavior is normal. Judgment and thought content normal.       Lab Results  Component Value Date   WBC 5.6 11/09/2009   HGB 13.3 11/09/2009   HCT 38.3 11/09/2009   PLT 232.0 11/09/2009   CHOL 158 11/09/2009   TRIG 97.0 11/09/2009   HDL 36.40* 11/09/2009   ALT 20 11/09/2009   AST 17 11/09/2009   NA 141 11/09/2009   K 4.2 11/09/2009   CL 106 11/09/2009   CREATININE 0.7 11/09/2009   BUN 12 11/09/2009   CO2 28 11/09/2009   TSH 1.85 11/09/2009     Assessment & Plan:  RUQ pain/R pleurisy discomfort - ongoing >4 weeks, mild-mod, worse with activity/exertion - not positional or reproducible with palpation - Recent cardiac eval for palpitations unremarkable (reviewed 02/2010 holter results) Check labs and cxr now - if normal, recommend OTC trial NSAIDs for presumed inflammatory process - pt understands  and agrees, will call if worse

## 2010-05-09 NOTE — Telephone Encounter (Signed)
Please call patient - normal or stable test results (labs and CXR) - no evidence for lung or liver problem. No medication changes recommended, should try aleve bid x 10days as we discussed at OV - call if continued or worsening problems for other eval as needed. Thanks.

## 2010-05-09 NOTE — Patient Instructions (Signed)
It was good to see you today. Test(s) ordered today. Your results will be called to you after review (48-72hours after test completion). If any changes need to be made, you will be notified at that time. If tests normal, try aleve 2x/ day for 10days as antiinflammatory trial - call if pain or breathing symptoms worse or unimproved

## 2010-05-10 NOTE — Telephone Encounter (Signed)
Pt advised of lab results in detail. Pt also advised that Gallbladder normal as well per VAL

## 2010-05-13 ENCOUNTER — Other Ambulatory Visit (HOSPITAL_COMMUNITY): Payer: Self-pay | Admitting: Gastroenterology

## 2010-05-18 ENCOUNTER — Ambulatory Visit (HOSPITAL_COMMUNITY)
Admission: RE | Admit: 2010-05-18 | Discharge: 2010-05-18 | Disposition: A | Discharge status: Home / Self Care | Payer: BC Managed Care – PPO | Source: Ambulatory Visit | Attending: Gastroenterology | Admitting: Gastroenterology

## 2010-05-18 DIAGNOSIS — R109 Unspecified abdominal pain: Secondary | ICD-10-CM | POA: Insufficient documentation

## 2010-05-24 ENCOUNTER — Encounter (HOSPITAL_COMMUNITY)
Admission: RE | Admit: 2010-05-24 | Discharge: 2010-05-24 | Disposition: A | Discharge status: Home / Self Care | Payer: BC Managed Care – PPO | Source: Ambulatory Visit | Attending: Gastroenterology | Admitting: Gastroenterology

## 2010-05-24 DIAGNOSIS — R109 Unspecified abdominal pain: Secondary | ICD-10-CM | POA: Insufficient documentation

## 2010-05-24 MED ORDER — TECHNETIUM TC 99M MEBROFENIN IV KIT
5.0000 | PACK | Freq: Once | INTRAVENOUS | Status: AC | PRN
  Administered 2010-05-24: 5 via INTRAVENOUS

## 2010-05-27 ENCOUNTER — Encounter: Payer: Self-pay | Admitting: Internal Medicine

## 2010-06-28 ENCOUNTER — Encounter: Payer: Self-pay | Admitting: Cardiovascular Disease

## 2010-07-19 ENCOUNTER — Encounter: Payer: Self-pay | Admitting: Cardiovascular Disease

## 2010-07-29 ENCOUNTER — Encounter: Payer: Self-pay | Admitting: Internal Medicine

## 2010-07-29 DIAGNOSIS — I499 Cardiac arrhythmia, unspecified: Secondary | ICD-10-CM

## 2010-10-28 ENCOUNTER — Telehealth: Payer: Self-pay | Admitting: Cardiovascular Disease

## 2010-10-28 NOTE — Telephone Encounter (Signed)
Pt called to report that last week [place], she [place] for breakfast and felt a fluttering and rapid heart rate.  Pulse was 148.  She states that after that, she was unable to tell if it was fast or slow because of the fluttering feeling.  This lasted about 5-6 minutes.  She has been feeling a faint fluttering about 2-3 times since that time.  She states her normal heart rate is in the 90's.  She has also noticed some sharp stabbing pains in her chest off and on since before her last visit with Dr Kirke Corin.   These are not exertional and just a quick stab.

## 2010-10-28 NOTE — Telephone Encounter (Signed)
Pt was scheduled with Norma Fredrickson Monday am for evaluation of this.

## 2010-10-28 NOTE — Telephone Encounter (Signed)
Pt calling wanting to inform nurse that last week pt had issue where heart was racing, pt took pulse and it was 148. Pt also c/o sharp quick pains in chest. Pt said it is so fast. Pt said she is fine right now but might have a episode every two to three days. Pt said she has had a couple quick, sharp pains that don't even last one second. Pt NOT c/o dizziness or sob.

## 2010-10-31 ENCOUNTER — Encounter: Payer: Self-pay | Admitting: Nurse Practitioner

## 2010-10-31 ENCOUNTER — Ambulatory Visit (INDEPENDENT_AMBULATORY_CARE_PROVIDER_SITE_OTHER): Payer: BC Managed Care – PPO | Admitting: Nurse Practitioner

## 2010-10-31 VITALS — BP 118/84 | HR 97 | Ht 68.0 in | Wt 183.8 lb

## 2010-10-31 DIAGNOSIS — R002 Palpitations: Secondary | ICD-10-CM

## 2010-10-31 MED ORDER — METOPROLOL SUCCINATE ER 25 MG PO TB24: 25.0000 mg | ORAL_TABLET | Freq: Every day | ORAL | Status: DC

## 2010-10-31 NOTE — Patient Instructions (Signed)
You need to decaffeinate yourself  We will try the low dose Toprol at 25 mg - take each night. You make take an extra half to whole tablet if you have a recurrent spell  I will see you back in a month.    Call for any problems  Keep up your exercise habit.

## 2010-10-31 NOTE — Assessment & Plan Note (Signed)
She has had a negative Holter and echo back in February of this year. She reports having had recent labs, so we will not need to repeat. At that time it was Dr. Jari Sportsman suggestion to try Toprol. She is willing to use at this time. She is advised to decaffeinate herself. I have started her on Toprol 25 mg QHS with an extra half to whole prn for recurrent episodes. I will see her back in a month. She is to continue to exercise and focus on her weight loss. Patient is agreeable to this plan and will call if any problems develop in the interim.

## 2010-10-31 NOTE — Progress Notes (Signed)
    Alyssa Pratt Date of Birth: Jul 15, 1965 Medical Record #[id number]  History of Present Illness: Alyssa Pratt is seen back today for a work in visit. She is seen for Dr. Kirke Corin. She has had a recurrent spell of palpitations last week. She was on vacation with her husband and had stopped for breakfast. While there, she felt her heart fluttering. She checked her heart rate with an app off of her phone and it read 148. This lasted for about 5 minutes. She had another spell after getting back in the car. None since. She was concerned. No syncope. No chest pain. She does have an occasional sharp fleeting pain that she has reported in the past. No exertional symptoms. She continues to exercise 4 to 5 times per week. She does use caffeine. No alcohol. She admits to more stress.   She has had palpitations in the past and had a negative Holter and echo back in February.   No current outpatient prescriptions on file prior to visit.    No Known Allergies  Past Medical History  Diagnosis Date  . Palpitations     negative holter and echo 02/2010   . GERD (gastroesophageal reflux disease)   . Anxiety   . Cardiac arrhythmia     negative holter and cardiac workup [facility] 2003?   Marland Kitchen Elevated blood pressure     hx  . Depression   . BLURRED VISION 05/20/2008  . Disturbance of skin sensation 05/20/2008  . FATIGUE 08/24/2008  . GERD 08/24/2008  . HYPERLIPIDEMIA, MILD 08/24/2008  . MORTON'S NEUROMA, LEFT 11/16/2009  . TESTOSTERONE DEFICIENCY 05/20/2008    Past Surgical History  Procedure Date  . Cesarean section 5/91, 6/93, 7/02    History  Smoking status  . Never Smoker   Smokeless tobacco  . Not on file  Comment: Married, lives with spouse. works part time as Diplomatic Services operational officer    History  Alcohol Use No    Denies alcohol use.     Family History  Problem Relation Age of Onset  . Hypertension Father   . Hypertension Brother     Review of Systems: The review of systems is positive for recurrent  palpitations. She notes that her pulse tends to run "fast".  All other systems were reviewed and are negative.  Physical Exam: BP 118/84  Pulse 97  Ht 5\' 8"  (1.727 m)  Wt 183 lb 12.8 oz (83.371 kg)  BMI 27.95 kg/m2 Patient is very pleasant and in no acute distress. Skin is warm and dry. Color is normal.  HEENT is unremarkable. Normocephalic/atraumatic. PERRL. Sclera are nonicteric. Neck is supple. No masses. No JVD. Lungs are clear. Cardiac exam shows a regular rate and rhythm. Abdomen is soft. Extremities are without edema. Gait and ROM are intact. No gross neurologic deficits noted.   LABORATORY DATA: EKG shows sinus rhythm. Rate is 97.   Assessment / Plan:

## 2010-12-01 ENCOUNTER — Ambulatory Visit: Payer: BC Managed Care – PPO | Admitting: Nurse Practitioner

## 2011-01-12 ENCOUNTER — Ambulatory Visit: Payer: BC Managed Care – PPO | Admitting: Nurse Practitioner

## 2011-06-01 ENCOUNTER — Ambulatory Visit (INDEPENDENT_AMBULATORY_CARE_PROVIDER_SITE_OTHER): Payer: BC Managed Care – PPO | Admitting: Cardiovascular Disease

## 2011-06-01 ENCOUNTER — Encounter: Payer: Self-pay | Admitting: Cardiovascular Disease

## 2011-06-01 VITALS — BP 132/88 | HR 104 | Ht 68.0 in | Wt 185.0 lb

## 2011-06-01 DIAGNOSIS — R079 Chest pain, unspecified: Secondary | ICD-10-CM

## 2011-06-01 DIAGNOSIS — R002 Palpitations: Secondary | ICD-10-CM

## 2011-06-01 NOTE — Progress Notes (Signed)
HPI  This is a 46 year old female who is here today for a followup visit. She was seen by me in the past for palpitations and tachycardia. She had an echocardiogram done which showed normal LV systolic function without significant structural or valvular abnormalities. She had a Holter monitor done which showed few PACs without any other significant arrhythmia. She had sinus tachycardia with slightly elevated mean average heart rate. The patient's symptoms seems to correlate mostly with anxiety. She was seen a few months ago in our office in [place] after she had an episode of tachycardia with a heart rate of 145 beats per minute while she was sitting in a restaurant. The episode lasted for about 5 minutes. She was given a prescription for Toprol 25 mg to be used as needed. She currently gets episodes of fast heartbeats with a heart rate around 110. These episodes last for only a few minutes. She has not had any syncope or presyncope. She has been trying to exercise more but has noticed chest tightness every time she exercises.  No Known Allergies   Current Outpatient Prescriptions on File Prior to Visit  Medication Sig Dispense Refill  . Probiotic Product (PROBIOTIC PO) Take by mouth daily.        Marland Kitchen DISCONTD: metoprolol succinate (TOPROL XL) 25 MG 24 hr tablet Take 1 tablet (25 mg total) by mouth daily.  30 tablet  11     Past Medical History  Diagnosis Date  . Palpitations     negative holter and echo 02/2010   . GERD (gastroesophageal reflux disease)   . Anxiety   . Cardiac arrhythmia     negative holter and cardiac workup [place] 2003?   Marland Kitchen Elevated blood pressure     hx  . Depression   . BLURRED VISION 05/20/2008  . 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 FATIGUE 08/24/2008  . GERD 08/24/2008  . HYPERLIPIDEMIA, MILD 08/24/2008  . MORTON'S NEUROMA, LEFT 11/16/2009  . TESTOSTERONE DEFICIENCY 05/20/2008     Past Surgical History  Procedure Date  . Cesarean section  5/91, 6/93, 7/02     Family History  Problem Relation Age of Onset  . Hypertension Father   . Hypertension Brother      History   Social History  . Marital Status: Married    Spouse Name: N/A    Number of Children: 3  . Years of Education: N/A   Occupational History  . Environmental health practitioner    Social History Main Topics  . Smoking status: Never Smoker   . Smokeless tobacco: Not on file   Comment: Married, lives with spouse. works part time as [employer] Services operational officer  . Alcohol Use: No     Denies alcohol use.   . Drug Use: No  . Sexually Active: Yes   Other Topics Concern  . Not on file   Social History Narrative  . No narrative on file        PHYSICAL EXAM   BP 132/88  Pulse 104  Ht 5\' 8"  (1.727 m)  Wt 185 lb (83.915 kg)  BMI 28.13 kg/m2 Constitutional: She is oriented to person, place, and time. She appears well-developed and well-nourished. No distress.  HENT: No nasal discharge.  Head: Normocephalic and atraumatic.  Eyes: Pupils are equal and round. Right eye exhibits no discharge. Left eye exhibits no discharge.  Neck: Normal range of motion. Neck supple. No JVD present. No thyromegaly present.  Cardiovascular: Normal rate, regular rhythm, normal heart sounds.  Exam reveals no gallop and no friction rub. No murmur heard.  Pulmonary/Chest: Effort normal and breath sounds normal. No stridor. No respiratory distress. She has no wheezes. She has no rales. She exhibits no tenderness.  Abdominal: Soft. Bowel sounds are normal. She exhibits no distension. There is no tenderness. There is no rebound and no guarding.  Musculoskeletal: Normal range of motion. She exhibits no edema and no tenderness.  Neurological: She is alert and oriented to person, place, and time. Coordination normal.  Skin: Skin is warm and dry. No rash noted. She is not diaphoretic. No erythema. No pallor.  Psychiatric: She has a normal mood and affect. Her behavior is normal. Judgment and thought  content normal.     EKG: Sinus tachycardia with no significant ST or T wave changes.   ASSESSMENT AND PLAN

## 2011-06-01 NOTE — Procedures (Signed)
    Treadmill Stress test  Indication: Chest pain.  Baseline Data:  Resting EKG shows NSR with rate of 102 bpm, no significant ST or T wave changes. Resting blood pressure of 120/80 mm Hg Stand bruce protocal was used.  Exercise Data:  Patient exercised for 8 min 0 sec,  Peak heart rate of 166 bpm.  This was 96 % of the maximum predicted heart rate. No symptoms of chest pain or lightheadedness were reported at peak stress or in recovery.  Peak Blood pressure recorded was 140/86 Maximal work level: 10.1 METs.  Heart rate at 3 minutes in recovery was 119 bpm. BP response: Normal. HR response: Accelerated.  EKG with Exercise: Sinus tachycardia with no significant ST or T wave changes.  FINAL IMPRESSION: Normal exercise stress test. No significant EKG changes concerning for ischemia. Good exercise tolerance. Sinus tachycardia at baseline.  Recommendation: Recommend treatment with a beta blocker as well as an exercise program.

## 2011-06-01 NOTE — Patient Instructions (Signed)
Your stress test was normal.  Start an exercise program with target heart rate of 135-155.  Take Toprol 25 mg once daily on a regular basis.  Follow up in 3 months.

## 2011-06-01 NOTE — Assessment & Plan Note (Signed)
She has been having exertional chest pain but according to her it happens when her heart rate is about 160 beats per minute. I suspect that she has exaggerated heart rate response to exercise leading to chest discomfort. I will obtain a treadmill stress test to further evaluate this as well as her heart rate response to exercise. I asked her to start an exercise program with a target heart rate between 135-155 beats per minute.

## 2011-06-01 NOTE — Patient Instructions (Signed)
Your physician has requested that you have an exercise tolerance test. For further information please visit [email or web address]. Please also follow instruction sheet, as given.   

## 2011-06-01 NOTE — Assessment & Plan Note (Signed)
Her echocardiogram in the past was unremarkable. Most of her symptoms were felt to be due to sinus tachycardia mostly triggered by stress and anxiety. I instructed her to start taking Toprol-XL 25 mg once daily in a regular basis and not as needed. We'll reevaluate her symptoms in 3 months and consider increasing the dose if needed.

## 2011-08-17 ENCOUNTER — Encounter: Payer: Self-pay | Admitting: Cardiovascular Disease

## 2011-08-17 ENCOUNTER — Ambulatory Visit: Payer: BC Managed Care – PPO | Admitting: Cardiovascular Disease

## 2011-08-17 ENCOUNTER — Ambulatory Visit (INDEPENDENT_AMBULATORY_CARE_PROVIDER_SITE_OTHER): Payer: BC Managed Care – PPO | Admitting: Cardiovascular Disease

## 2011-08-17 VITALS — BP 120/86 | HR 94 | Ht 64.0 in | Wt 190.2 lb

## 2011-08-17 DIAGNOSIS — R002 Palpitations: Secondary | ICD-10-CM

## 2011-08-17 NOTE — Patient Instructions (Addendum)
Your physician has recommended that you wear an event monitor. Event monitors are medical devices that record the heart's electrical activity. Doctors most often [place] these monitors to diagnose arrhythmias. Arrhythmias are problems with the speed or rhythm of the heartbeat. The monitor is a small, portable device. You can wear one while you do your normal daily activities. This is usually used to diagnose what is causing palpitations/syncope (passing out).  Follow up in 6 months.

## 2011-08-17 NOTE — Progress Notes (Signed)
HPI  This is a 46 year old pleasant female who is here today for a followup visit regarding palpitations and tachycardia. She had an echocardiogram done in 2012 which showed normal LV systolic function without significant structural or valvular abnormalities. She had a Holter monitor done which showed few PACs without any other significant arrhythmia. She had sinus tachycardia with slightly elevated mean average heart rate. The patient's symptoms seems to correlate mostly with anxiety. She was seen a few months ago in our office in [place] early this year after she had an episode of tachycardia with a heart rate of 145 beats per minute while she was sitting in a restaurant. The episode lasted for about 5 minutes.  She also had chest tightness. She underwent a treadmill stress test during last visit which showed no evidence of ischemia. She was started on Toprol 25 mg once daily. Although the "skipping in her heart"improved, she started having more frequent episodes of what seems to be tachycardia with throat discomfort and pulsating feeling in her neck. These episodes happen mostly at rest. She actually feels better with exercise.  No Known Allergies   Current Outpatient Prescriptions on File Prior to Visit  Medication Sig Dispense Refill  . metoprolol succinate (TOPROL-XL) 25 MG 24 hr tablet Take 25 mg by mouth daily.          Past Medical History  Diagnosis Date  . Palpitations     negative holter and echo 02/2010   . GERD (gastroesophageal reflux disease)   . Anxiety   . Cardiac arrhythmia     negative holter and cardiac workup  2003?   Marland Kitchen Elevated blood pressure     hx  . Depression   . BLURRED VISION 05/20/2008  . Disturbance of skin sensation 05/20/2008  . FATIGUE 08/24/2008  . GERD 08/24/2008  . HYPERLIPIDEMIA, MILD 08/24/2008  . MORTON'S NEUROMA, LEFT 11/16/2009  . TESTOSTERONE DEFICIENCY 05/20/2008     Past Surgical History  Procedure Date  . Cesarean section  5/91, 6/93, 7/02     Family History  Problem Relation Age of Onset  . Hypertension Father   . Hypertension Brother      History   Social History  . Marital Status: Married    Spouse Name: N/A    Number of Children: 3  . Years of Education: N/A   Occupational History  . Environmental health practitioner    Social History Main Topics  . Smoking status: Never Smoker   . Smokeless tobacco: Not on file   Comment: Married, lives with spouse. works part time as Dipl[employer] Services operational officer  . Alcohol Use: No     Denies alcohol use.   . Drug Use: No  . Sexually Active: Yes   Other Topics Concern  . Not on file   Social History Narrative  . No narrative on file      PHYSICAL EXAM   BP 120/86  Pulse 94  Ht 5\' 4"  (1.626 m)  Wt 190 lb 4 oz (86.297 kg)  BMI 32.66 kg/m2  Constitutional: She is oriented to person, place, and time. She appears well-developed and well-nourished. No distress.  HENT: No nasal discharge.  Head: Normocephalic and atraumatic.  Eyes: Pupils are equal and round. Right eye exhibits no discharge. Left eye exhibits no discharge.  Neck: Normal range of motion. Neck supple. No JVD present. No thyromegaly present.  Cardiovascular: Normal rate, regular rhythm, normal heart sounds. Exam reveals no gallop and no friction rub. No murmur heard.  Pulmonary/Chest:  Effort normal and breath sounds normal. No stridor. No respiratory distress. She has no wheezes. She has no rales. She exhibits no tenderness.  Abdominal: Soft. Bowel sounds are normal. She exhibits no distension. There is no tenderness. There is no rebound and no guarding.  Musculoskeletal: Normal range of motion. She exhibits no edema and no tenderness.  Neurological: She is alert and oriented to person, place, and time. Coordination normal.  Skin: Skin is warm and dry. No rash noted. She is not diaphoretic. No erythema. No pallor.  Psychiatric: She has a normal mood and affect. Her behavior is normal. Judgment and thought  content normal.    EKG: Normal sinus rhythm with no significant ST or T wave changes. Normal PR and QT intervals.    ASSESSMENT AND PLAN

## 2011-08-17 NOTE — Assessment & Plan Note (Signed)
The patient seems to be having more episodes of palpitations in spite of treatment with a small dose metoprolol. Her current t episodes seem to be suggestive of possible supraventricular tachycardia.  Thus, I recommend evaluation with a 14 days outpatient telemetry. Continue treatment with Toprol 25 mg once daily.

## 2011-08-29 ENCOUNTER — Telehealth: Payer: Self-pay | Admitting: *Deleted

## 2011-08-29 NOTE — Telephone Encounter (Signed)
Patient called and mentioned that she was concerned because she felt like her heart was skipping a beat yesterday and today she had a episode and that it felt like it is out of rhythm today. Patient would like to know if we have received any information from e-cardio from these episodes. She would like to know if she needs to be seen or should she continue wearing the monitor because she is worried about feeling like this? She didn't mention having any symptoms and has not checked her HR or BP. She would like to be contacted on her cell phone [phone].

## 2011-08-29 NOTE — Telephone Encounter (Signed)
Pt was notified that her monitor showed some early beats (pacs).  I recommended that she keep wearing the monitor as long as Dr Mariah Milling wanted her to wear it.  She agreed.

## 2011-08-29 NOTE — Telephone Encounter (Signed)
N/A.  LMTC. 

## 2011-09-01 ENCOUNTER — Ambulatory Visit: Payer: BC Managed Care – PPO | Admitting: Cardiovascular Disease

## 2011-11-10 ENCOUNTER — Other Ambulatory Visit: Payer: Self-pay | Admitting: *Deleted

## 2011-11-10 ENCOUNTER — Other Ambulatory Visit: Payer: Self-pay | Admitting: Nurse Practitioner

## 2011-11-10 MED ORDER — METOPROLOL SUCCINATE ER 25 MG PO TB24: 25.0000 mg | ORAL_TABLET | Freq: Every day | ORAL | Status: DC

## 2012-02-14 ENCOUNTER — Other Ambulatory Visit: Payer: Self-pay | Admitting: *Deleted

## 2012-02-14 ENCOUNTER — Telehealth: Payer: Self-pay

## 2012-02-14 ENCOUNTER — Ambulatory Visit: Payer: BC Managed Care – PPO

## 2012-02-14 DIAGNOSIS — R002 Palpitations: Secondary | ICD-10-CM

## 2012-02-14 DIAGNOSIS — I499 Cardiac arrhythmia, unspecified: Secondary | ICD-10-CM

## 2012-02-14 NOTE — Progress Notes (Signed)
Pt sent for E-cardio 08/04/2011-09/04/2011.

## 2012-02-14 NOTE — Telephone Encounter (Signed)
Dr. Kirke Corin reviewed event monitor results "sinus rhythm with PAC's. No SVT or other arrythmias" VO Dr. Adline Mango, RN Has appt with Dr. Kirke Corin 2/18 will discuss then

## 2012-02-20 ENCOUNTER — Encounter: Payer: Self-pay | Admitting: Cardiovascular Disease

## 2012-02-20 ENCOUNTER — Ambulatory Visit (INDEPENDENT_AMBULATORY_CARE_PROVIDER_SITE_OTHER): Payer: BC Managed Care – PPO | Admitting: Cardiovascular Disease

## 2012-02-20 VITALS — BP 142/50 | HR 95 | Ht 68.0 in | Wt 195.5 lb

## 2012-02-20 DIAGNOSIS — R002 Palpitations: Secondary | ICD-10-CM

## 2012-02-20 MED ORDER — METOPROLOL SUCCINATE ER 50 MG PO TB24: 50.0000 mg | ORAL_TABLET | Freq: Every day | ORAL | Status: DC

## 2012-02-20 NOTE — Assessment & Plan Note (Signed)
Had palpitations seem to be clearly due to PACs. Her symptoms improved with small dose metoprolol but she is still symptomatic. Previous echocardiogram showed no evidence of structural heart disease. Treadmill stress test showed no evidence of ischemia or exercise induced arrhythmia. Her labs including thyroid function has been normal. I recommend increasing the dose of metoprolol extended release to 50 mg once daily. Followup in one year or earlier if needed.

## 2012-02-20 NOTE — Progress Notes (Signed)
HPI  This is a 47 year old pleasant female who is here today for a followup visit regarding palpitations due to documented PACs. She had an echocardiogram done in 2012 which showed normal LV systolic function without significant structural or valvular abnormalities. She had a Holter monitor done which showed few PACs without any other significant arrhythmia. She had sinus tachycardia with slightly elevated mean average heart rate. The patient's symptoms seems to correlate mostly with anxiety. She was seen last year in our office in [place] after she had an episode of tachycardia with a heart rate of 145 beats per minute while she was sitting in a restaurant. The episode lasted for about 5 minutes.  She also had chest tightness. She underwent a treadmill stress test at that time which showed no evidence of ischemia. She was started on Toprol 25 mg once daily. Her symptoms improved but she continued to have tachycardia. Thus, I proceeded with a 14 day outpatient telemetry which basically showed frequent PACs with no other significant arrhythmia. Her symptoms of palpitations correlated with PACs.  No Known Allergies   No current outpatient prescriptions on file prior to visit.   No current facility-administered medications on file prior to visit.     Past Medical History  Diagnosis Date  . Palpitations     negative holter and echo 02/2010   . GERD (gastroesophageal reflux disease)   . Anxiety   . Cardiac arrhythmia     negative holter and cardiac workup [place] 2003?   Marland Kitchen Elevated blood pressure     hx  . Depression   . BLURRED VISION 05/20/2008  . Disturbance of skin sensation 05/20/2008  . FATIGUE 08/24/2008  . GERD 08/24/2008  . HYPERLIPIDEMIA, MILD 08/24/2008  . MORTON'S NEUROMA, LEFT 11/16/2009  . TESTOSTERONE DEFICIENCY 05/20/2008     Past Surgical History  Procedure Laterality Date  . Cesarean section  5/91, 6/93, 7/02     Family History  Problem Relation Age of Onset    . Hypertension Father   . Hypertension Brother      History   Social History  . Marital Status: Married    Spouse Name: N/A    Number of Children: 3  . Years of Education: N/A   Occupational History  . Environmental health practitioner    Social History Main Topics  . Smoking status: Never Smoker   . Smokeless tobacco: Not on file     Comment: Married, lives with spouse. works part time as [employer] Services operational officer  . Alcohol Use: No     Comment: Denies alcohol use.   . Drug Use: No  . Sexually Active: Yes   Other Topics Concern  . Not on file   Social History Narrative  . No narrative on file      PHYSICAL EXAM   BP 142/50  Pulse 95  Ht 5\' 8"  (1.727 m)  Wt 195 lb 8 oz (88.678 kg)  BMI 29.73 kg/m2  Constitutional: She is oriented to person, place, and time. She appears well-developed and well-nourished. No distress.  HENT: No nasal discharge.  Head: Normocephalic and atraumatic.  Eyes: Pupils are equal and round. Right eye exhibits no discharge. Left eye exhibits no discharge.  Neck: Normal range of motion. Neck supple. No JVD present. No thyromegaly present.  Cardiovascular: Normal rate, regular rhythm, normal heart sounds. Exam reveals no gallop and no friction rub. No murmur heard.  Pulmonary/Chest: Effort normal and breath sounds normal. No stridor. No respiratory distress. She has no  wheezes. She has no rales. She exhibits no tenderness.  Abdominal: Soft. Bowel sounds are normal. She exhibits no distension. There is no tenderness. There is no rebound and no guarding.  Musculoskeletal: Normal range of motion. She exhibits no edema and no tenderness.  Neurological: She is alert and oriented to person, place, and time. Coordination normal.  Skin: Skin is warm and dry. No rash noted. She is not diaphoretic. No erythema. No pallor.  Psychiatric: She has a normal mood and affect. Her behavior is normal. Judgment and thought content normal.    EKG: Normal Sinus  Rhythm  Normal  ECG  ASSESSMENT AND PLAN

## 2012-02-20 NOTE — Patient Instructions (Addendum)
Increase Metoprolol ER to 50 mg once daily.  Follow up in 1 year.

## 2012-03-26 ENCOUNTER — Other Ambulatory Visit (INDEPENDENT_AMBULATORY_CARE_PROVIDER_SITE_OTHER): Payer: BC Managed Care – PPO

## 2012-03-26 ENCOUNTER — Encounter: Payer: Self-pay | Admitting: Internal Medicine

## 2012-03-26 ENCOUNTER — Ambulatory Visit (INDEPENDENT_AMBULATORY_CARE_PROVIDER_SITE_OTHER): Payer: BC Managed Care – PPO | Admitting: Internal Medicine

## 2012-03-26 VITALS — BP 110/78 | HR 94 | Temp 98.2°F | Wt 197.0 lb

## 2012-03-26 DIAGNOSIS — H539 Unspecified visual disturbance: Secondary | ICD-10-CM

## 2012-03-26 DIAGNOSIS — F411 Generalized anxiety disorder: Secondary | ICD-10-CM

## 2012-03-26 DIAGNOSIS — F419 Anxiety disorder, unspecified: Secondary | ICD-10-CM | POA: Insufficient documentation

## 2012-03-26 LAB — CBC WITH DIFFERENTIAL/PLATELET
Basophils Absolute: 0 10*3/uL (ref 0.0–0.1)
Basophils Relative: 0.5 % (ref 0.0–3.0)
Eosinophils Absolute: 0.1 10*3/uL (ref 0.0–0.7)
Eosinophils Relative: 1.6 % (ref 0.0–5.0)
HCT: 40.2 % (ref 36.0–46.0)
Hemoglobin: 13.5 g/dL (ref 12.0–15.0)
Lymphocytes Relative: 22.8 % (ref 12.0–46.0)
Lymphs Abs: 1.3 10*3/uL (ref 0.7–4.0)
MCHC: 33.7 g/dL (ref 30.0–36.0)
MCV: 89.3 fl (ref 78.0–100.0)
Monocytes Absolute: 0.4 10*3/uL (ref 0.1–1.0)
Monocytes Relative: 6.5 % (ref 3.0–12.0)
Neutro Abs: 3.8 10*3/uL (ref 1.4–7.7)
Neutrophils Relative %: 68.6 % (ref 43.0–77.0)
Platelets: 267 10*3/uL (ref 150.0–400.0)
RBC: 4.49 Mil/uL (ref 3.87–5.11)
RDW: 12.8 % (ref 11.5–14.6)
WBC: 5.5 10*3/uL (ref 4.5–10.5)

## 2012-03-26 LAB — BASIC METABOLIC PANEL
BUN: 14 mg/dL (ref 6–23)
CO2: 28 mEq/L (ref 19–32)
Calcium: 9.3 mg/dL (ref 8.4–10.5)
Chloride: 106 mEq/L (ref 96–112)
Creatinine, Ser: 0.6 mg/dL (ref 0.4–1.2)
GFR: 105.24 mL/min (ref >60.00)
Glucose, Bld: 91 mg/dL (ref 70–99)
Potassium: 4 mEq/L (ref 3.5–5.1)
Sodium: 139 mEq/L (ref 135–145)

## 2012-03-26 LAB — SEDIMENTATION RATE: Sed Rate: 16 mm/hr (ref 0–22)

## 2012-03-26 LAB — HEPATIC FUNCTION PANEL
ALT: 16 U/L (ref 0–35)
AST: 12 U/L (ref 0–37)
Albumin: 4.3 g/dL (ref 3.5–5.2)
Alkaline Phosphatase: 61 U/L (ref 39–117)
Bilirubin, Direct: 0.1 mg/dL (ref 0.0–0.3)
Total Bilirubin: 0.5 mg/dL (ref 0.3–1.2)
Total Protein: 7.3 g/dL (ref 6.0–8.3)

## 2012-03-26 LAB — TSH: TSH: 1.6 u[IU]/mL (ref 0.35–5.50)

## 2012-03-26 MED ORDER — ASPIRIN EC 81 MG PO TBEC: 81.0000 mg | DELAYED_RELEASE_TABLET | Freq: Every day | ORAL | Status: DC

## 2012-03-26 NOTE — Progress Notes (Signed)
Subjective:    Patient ID: Alyssa Pratt, female    DOB: [birth year], 47 y.o.   MRN: [id number] CC: Transient loss of vision x 10 minutes HPI Patient presents today with a CC of transient loss of vision x 10 minutes that occurred last Wednesday (03/20/12) while driving on her way to work. She believes the vision loss occurred on the right side as she could still see pieces of cars and road signs but not whole objects. This lasted for about 10 minutes and resolved as she arrived at work. As she began working on the computer she developed a "kolidascope vision" characterized by flashing lights. She then began to develop a HA on the left side of her head and left work. Advil Migraine was taken for the pain and the patient rested. This resolved the issue. She is unsure if this issue precipitated from a recent Toporol dose change from 25mg  to 50mg  which has caused a "swimming headed" feeling and lightheadedness. Since the transient loss of vision she has decreased her dose down to 25mg . She does not have a personal history of migraines, but her mother experiences migraines. She has not yet began menopause. Last labs completed in 2012, patient has no hx of hyperlipidemia or htn.   Past Medical History  Diagnosis Date  . Palpitations     sinus tach and PACs on holter and normal echo 02/2010 , no ischemia ETT 05/2011  . GERD (gastroesophageal reflux disease)   . Anxiety   . Elevated blood pressure     hx  . Depression   . GERD   . HYPERLIPIDEMIA, MILD   . MORTON'S NEUROMA, LEFT   . TESTOSTERONE DEFICIENCY     Review of Systems  Constitutional: Positive for fatigue. Negative for fever and unexpected weight change.  HENT: Negative for hearing loss and tinnitus.   Respiratory: Negative for cough, chest tightness and shortness of breath.   Cardiovascular: Positive for palpitations (known hx of PVC's). Negative for chest pain.  Neurological: Positive for light-headedness (since Toprol dose incease mid Feb)  and headaches. Negative for dizziness, facial asymmetry and speech difficulty.       Objective:   Physical Exam  Constitutional: She is oriented to person, place, and time. She appears well-developed and well-nourished. No distress.  HENT:  Head: Normocephalic and atraumatic.  Eyes: Conjunctivae and EOM are normal. Pupils are equal, round, and reactive to light. Right eye exhibits no discharge. Left eye exhibits no discharge. No scleral icterus.  Neck: Normal range of motion. Neck supple. No JVD present. No tracheal deviation present. No thyromegaly present.  Cardiovascular: Normal rate and regular rhythm.  Exam reveals no gallop and no friction rub.   No murmur heard. Pulmonary/Chest: Effort normal and breath sounds normal. No respiratory distress. She has no wheezes.  Neurological: She is alert and oriented to person, place, and time. She has normal strength. No cranial nerve deficit or sensory deficit. She displays a negative Romberg sign.  Skin: Skin is warm and dry. No rash noted.  Psychiatric: She has a normal mood and affect. Her behavior is normal. Judgment and thought content normal.   Filed Vitals:   03/26/12 0908  BP: 110/78  Pulse: 94  Temp: 98.2 F (36.8 C)    Lab Results  Component Value Date   WBC 8.2 05/09/2010   HGB 13.9 05/09/2010   HCT 39.7 05/09/2010   PLT 237.0 05/09/2010   GLUCOSE 83 11/09/2009   CHOL 158 11/09/2009   TRIG 97.0 11/09/2009  HDL 36.40* 11/09/2009   LDLCALC 102* 11/09/2009   ALT 15 05/09/2010   AST 14 05/09/2010   NA 141 11/09/2009   K 4.2 11/09/2009   CL 106 11/09/2009   CREATININE 0.7 11/09/2009   BUN 12 11/09/2009   CO2 28 11/09/2009   TSH 1.85 11/09/2009       Assessment & Plan:  Transient loss of vision- Suspect occular manifestation of migraine given history of transient vision loss followed by HA which has now fully resolved, lack of ischemic heart hx/risk factors,  positive family history of migraine, and fully intact neuro exam.  However MRA,  MRI, Echo, and Carotid US ordered to evaluate possible ischemic cause. Patient to have CBC, Sed Rate, BMP, Hepatic Panel, TSH, and Urinalysis completed today. Patient to begin taking 81mg  Asprin until testing rules out ischemic event.  Hold on lipid check because patient is not fasting. Follow up on this event depending on results of testing.   Follow up in 3 months for normal health maintainece screening.   Alysa Duca PA-S  I have personally reviewed this case with PA student. I also personally examined this patient. I agree with history and findings as documented above. I reviewed, discussed and approve of the assessment and plan as listed above. Rene Paci, MD

## 2012-03-26 NOTE — Patient Instructions (Signed)
It was good to see you today. We have reviewed your prior records including labs and tests today Test(s) ordered today. Your results will be released to MyChart (or called to you) after review, usually within 72hours after test completion. If any changes need to be made, you will be notified at that same time. Medications reviewed and updated, recommended baby aspirin 81 mg daily until workup complete -no other changes at this time.

## 2012-03-26 NOTE — Progress Notes (Signed)
Subjective:    Patient ID: Alyssa Pratt, female    DOB: 1965/09/25, 47 y.o.   MRN: [id number]  HPI  Here for concern about transient vision loss  Also reviewed chronic medical issues:  Palpitations - extensive cardiac evaluation including 2-D echo February 2012 with normal LVEF and no structural or valvular abnormalities. Negative Holter monitor and 14 day ECG monitor ( sinus tach with PACs). Also treadmill stress test May 2013 negative for ischemia - on beta blocker for efforts at controlling same  Depression/anxiety - previously changed lexapro to wellbutrin 11/16/09 - poor medication tolerance of same complicated by fatigue symptoms, libido suppression and uncontrolled anxious/ nervous symptoms   GERD - reports compliance with ongoing medical treatment and no changes in medication dose or frequency. denies adverse side effects related to current therapy.   Past Medical History  Diagnosis Date  . Palpitations     sinus tach and PACs on holter and normal echo 02/2010 , no ischemia ETT 05/2011  . GERD (gastroesophageal reflux disease)   . Anxiety   . Elevated blood pressure     hx  . Depression   . GERD   . HYPERLIPIDEMIA, MILD   . MORTON'S NEUROMA, LEFT   . TESTOSTERONE DEFICIENCY     Review of Systems  Constitutional: Negative for diaphoresis and fatigue.  Respiratory: Negative for wheezing.   Cardiovascular: Negative for chest pain.  Genitourinary: Negative for dysuria and flank pain.  Musculoskeletal: Negative for back pain, joint swelling and gait problem.  Neurological: Negative for numbness.  Hematological: Does not bruise/bleed easily.       Objective:   Physical Exam  BP 110/78  Pulse 94  Temp(Src) 98.2 F (36.8 C) (Oral)  Wt 197 lb (89.359 kg)  BMI 29.96 kg/m2  SpO2 98% Wt Readings from Last 3 Encounters:  03/26/12 197 lb (89.359 kg)  02/20/12 195 lb 8 oz (88.678 kg)  08/17/11 190 lb 4 oz (86.297 kg)   Constitutional: She appears well-developed and  well-nourished. No distress.  HENT: Head: Normocephalic and atraumatic. Ears: B TMs ok, no erythema or effusion; Nose: Nose normal. Mouth/Throat: Oropharynx is clear and moist. No oropharyngeal exudate.  Eyes: Conjunctivae and EOM are normal. Vision accuity and peripheral field intact B. Pupils are equal, round, and reactive to light. No scleral icterus.  Neck: Normal range of motion. Neck supple. No JVD present. No thyromegaly present.  Cardiovascular: Normal rate, regular rhythm and normal heart sounds.  No murmur heard. No BLE edema. Pulmonary/Chest: Effort normal and breath sounds normal. No respiratory distress. She has no wheezes.  Musculoskeletal: Normal range of motion, no joint effusions. No gross deformities Neurological: She is alert and oriented to person, place, and time. No cranial nerve deficit. Coordination, speech, recall and gait are normal. negative Romberg. Finger to nose normal. Psychiatric: She has a minimally anxious mood and affect. Her behavior is normal. Judgment and thought content normal.       Lab Results  Component Value Date   WBC 8.2 05/09/2010   HGB 13.9 05/09/2010   HCT 39.7 05/09/2010   PLT 237.0 05/09/2010   CHOL 158 11/09/2009   TRIG 97.0 11/09/2009   HDL 36.40* 11/09/2009   ALT 15 05/09/2010   AST 14 05/09/2010   NA 141 11/09/2009   K 4.2 11/09/2009   CL 106 11/09/2009   CREATININE 0.7 11/09/2009   BUN 12 11/09/2009   CO2 28 11/09/2009   TSH 1.85 11/09/2009     Assessment & Plan:  Transient vision loss, binoccular followed by L sided headache -  Onset 3 days ago, no recurrence since today neuro exam benign -?Ocular migraine or other ischemic event Laboratory evaluation today to include sedimentation rate Arrange for carotid ultrasound, 2-D echo and MRI/MRA brain Recommend ophthalmology evaluation, though no persisting deficits today (2 eval in past 57mo with x change both times per pt report) Recommend aspirin 81 mg daily until further evaluation complete, no  other treatment changes recommended at this time

## 2012-03-28 ENCOUNTER — Encounter: Payer: Self-pay | Admitting: Internal Medicine

## 2012-03-28 ENCOUNTER — Ambulatory Visit
Admission: RE | Admit: 2012-03-28 | Discharge: 2012-03-28 | Disposition: A | Discharge status: Home / Self Care | Payer: BC Managed Care – PPO | Source: Ambulatory Visit | Attending: Internal Medicine | Admitting: Internal Medicine

## 2012-03-28 ENCOUNTER — Encounter (INDEPENDENT_AMBULATORY_CARE_PROVIDER_SITE_OTHER): Payer: BC Managed Care – PPO

## 2012-03-28 DIAGNOSIS — R42 Dizziness and giddiness: Secondary | ICD-10-CM

## 2012-03-28 DIAGNOSIS — H539 Unspecified visual disturbance: Secondary | ICD-10-CM

## 2012-03-28 DIAGNOSIS — H53129 Transient visual loss, unspecified eye: Secondary | ICD-10-CM

## 2012-04-04 ENCOUNTER — Ambulatory Visit (HOSPITAL_COMMUNITY): Payer: BC Managed Care – PPO | Attending: Internal Medicine | Admitting: Radiology

## 2012-04-04 ENCOUNTER — Ambulatory Visit (HOSPITAL_COMMUNITY): Payer: BC Managed Care – PPO

## 2012-04-04 DIAGNOSIS — G459 Transient cerebral ischemic attack, unspecified: Secondary | ICD-10-CM

## 2012-04-04 DIAGNOSIS — Z8673 Personal history of transient ischemic attack (TIA), and cerebral infarction without residual deficits: Secondary | ICD-10-CM | POA: Insufficient documentation

## 2012-04-04 DIAGNOSIS — H539 Unspecified visual disturbance: Secondary | ICD-10-CM

## 2012-04-04 DIAGNOSIS — R002 Palpitations: Secondary | ICD-10-CM | POA: Insufficient documentation

## 2012-04-04 NOTE — Progress Notes (Signed)
Echocardiogram performed.  

## 2012-10-29 ENCOUNTER — Ambulatory Visit (INDEPENDENT_AMBULATORY_CARE_PROVIDER_SITE_OTHER): Payer: BC Managed Care – PPO | Admitting: Obstetrics & Gynecology

## 2012-10-29 ENCOUNTER — Encounter: Payer: Self-pay | Admitting: Obstetrics & Gynecology

## 2012-10-29 VITALS — BP 120/70 | HR 84 | Resp 20 | Ht 67.5 in | Wt 193.6 lb

## 2012-10-29 DIAGNOSIS — Z124 Encounter for screening for malignant neoplasm of cervix: Secondary | ICD-10-CM

## 2012-10-29 DIAGNOSIS — Z0000 Encounter for general adult medical examination without abnormal findings: Secondary | ICD-10-CM

## 2012-10-29 DIAGNOSIS — Z01419 Encounter for gynecological examination (general) (routine) without abnormal findings: Secondary | ICD-10-CM

## 2012-10-29 LAB — POCT URINALYSIS DIPSTICK
Bilirubin, UA: NEGATIVE
Glucose, UA: NEGATIVE
Ketones, UA: NEGATIVE
Leukocytes, UA: NEGATIVE
Nitrite, UA: NEGATIVE
Protein, UA: NEGATIVE
Urobilinogen, UA: NEGATIVE
pH, UA: 5

## 2012-10-29 LAB — HEMOGLOBIN, FINGERSTICK: Hemoglobin, fingerstick: 13.1 g/dL (ref 12.0–16.0)

## 2012-10-29 NOTE — Patient Instructions (Signed)

## 2012-10-29 NOTE — Progress Notes (Signed)
47 y.o. G3P3 MarriedCaucasianF here for annual exam.  Knows Alyssa Pratt.  Went on a mission trip with her this summer.  Still cycling.  Still regular.  Former patient of Dr. Edward Jolly and Dr. Vela Prose.  Got flu shot yesterday.    Had ocular migraine in 3/14.  Had lots of blood work and MRI--in [facility].   H/O palpitations.  On toprol for this.  Dr. Kirke Corin is cardiologist.  Echo 2012 normal.    Patient's last menstrual period was 10/09/2012.          Sexually active: yes  The current method of family planning is tubal ligation.    Exercising: no  not regularly Smoker:  no  Health Maintenance: Pap:  Last year History of abnormal Pap:  no MMG:  10/13 normal Colonoscopy:  2012 repeat 10 years, Dr. Loreta Ave BMD:   none TDaP:  11/11 Screening Labs: with PCP, Hb today: 13.1, Urine today: RBC-trace   reports that she has never smoked. She has never used smokeless tobacco. She reports that she does not drink alcohol or use illicit drugs.  Past Medical History  Diagnosis Date  . Palpitations     sinus tach and PACs on holter and normal echo 02/2010 , no ischemia ETT 05/2011  . GERD (gastroesophageal reflux disease)   . Anxiety   . Elevated blood pressure     hx  . Depression   . HYPERLIPIDEMIA, MILD     not on medication  . MORTON'S NEUROMA, LEFT   . TESTOSTERONE DEFICIENCY     Past Surgical History  Procedure Laterality Date  . Cesarean section  5/91, 6/93, 7/02  . Tubal ligation      Current Outpatient Prescriptions  Medication Sig Dispense Refill  . aspirin EC 81 MG tablet Take 1 tablet (81 mg total) by mouth daily.  150 tablet  2  . metoprolol succinate (TOPROL-XL) 50 MG 24 hr tablet Take 25 mg by mouth daily. Take with or immediately following a meal.       No current facility-administered medications for this visit.    Family History  Problem Relation Age of Onset  . Hypertension Father   . Hypertension Brother     ROS:  Pertinent items are noted in HPI.  Otherwise, a  comprehensive ROS was negative.  Exam:   BP 120/70  Pulse 84  Resp 20  Ht 5' 7.5" (1.715 m)  Wt 193 lb 9.6 oz (87.816 kg)  BMI 29.86 kg/m2  LMP 10/09/2012     Height: 5' 7.5" (171.5 cm)  Ht Readings from Last 3 Encounters:  10/29/12 5' 7.5" (1.715 m)  02/20/12 5\' 8"  (1.727 m)  08/17/11 5\' 4"  (1.626 m)    General appearance: alert, cooperative and appears stated age Head: Normocephalic, without obvious abnormality, atraumatic Neck: no adenopathy, supple, symmetrical, trachea midline and thyroid normal to inspection and palpation Lungs: clear to auscultation bilaterally Breasts: normal appearance, no masses or tenderness Heart: regular rate and rhythm Abdomen: soft, non-tender; bowel sounds normal; no masses,  no organomegaly Extremities: extremities normal, atraumatic, no cyanosis or edema Skin: Skin color, texture, turgor normal. No rashes or lesions Lymph nodes: Cervical, supraclavicular, and axillary nodes normal. No abnormal inguinal nodes palpated Neurologic: Grossly normal   Pelvic: External genitalia:  no lesions              Urethra:  normal appearing urethra with no masses, tenderness or lesions  Bartholins and Skenes: normal                 Vagina: normal appearing vagina with normal color and discharge, no lesions              Cervix: no lesions              Pap taken: yes Bimanual Exam:  Uterus:  normal size, contour, position, consistency, mobility, non-tender              Adnexa: normal adnexa and no mass, fullness, tenderness               Rectovaginal: Confirms               Anus:  normal sphincter tone, no lesions  A:  Well Woman with normal exam Perimenopausal.  Precautions for bleeding changes given. H/O palpitations with normal echo 2012 and 2014 Ocular migraine earlier this year.  P:   Mammogram yearly.  Will get MMG copy and give recommendation about 3D pap smear obtained.  Will get records to see if need HPV testing. Labs UTD. Release  of records for colonoscopy. return annually or prn  An After Visit Summary was printed and given to the patient.

## 2012-11-05 ENCOUNTER — Other Ambulatory Visit: Payer: Self-pay | Admitting: *Deleted

## 2012-11-05 MED ORDER — METOPROLOL SUCCINATE ER 25 MG PO TB24: 25.0000 mg | ORAL_TABLET | Freq: Every day | ORAL | Status: DC

## 2012-11-05 NOTE — Telephone Encounter (Signed)
Requested Prescriptions   Signed Prescriptions Disp Refills  . metoprolol succinate (TOPROL-XL) 25 MG 24 hr tablet 30 tablet 3    Sig: Take 1 tablet (25 mg total) by mouth daily.    Authorizing Provider: GOLLAN, TIMOTHY J    Ordering User: Kenroy Timberman C    

## 2012-11-05 NOTE — Telephone Encounter (Signed)
Patient wants 25 mg

## 2012-11-07 ENCOUNTER — Other Ambulatory Visit: Payer: Self-pay

## 2012-11-15 NOTE — Addendum Note (Signed)
Addended by: Jerene Bears on: 11/15/2012 04:18 PM   Modules accepted: Orders

## 2012-11-20 LAB — IPS PAP TEST WITH HPV

## 2012-12-02 ENCOUNTER — Ambulatory Visit (INDEPENDENT_AMBULATORY_CARE_PROVIDER_SITE_OTHER): Payer: BC Managed Care – PPO | Admitting: Cardiovascular Disease

## 2012-12-02 ENCOUNTER — Encounter: Payer: Self-pay | Admitting: Cardiovascular Disease

## 2012-12-02 VITALS — BP 125/90 | HR 110 | Ht 68.0 in | Wt 196.2 lb

## 2012-12-02 DIAGNOSIS — R000 Tachycardia, unspecified: Secondary | ICD-10-CM

## 2012-12-02 DIAGNOSIS — R079 Chest pain, unspecified: Secondary | ICD-10-CM

## 2012-12-02 DIAGNOSIS — F411 Generalized anxiety disorder: Secondary | ICD-10-CM

## 2012-12-02 DIAGNOSIS — R002 Palpitations: Secondary | ICD-10-CM

## 2012-12-02 NOTE — Assessment & Plan Note (Signed)
She reports worsening symptoms. She appears to be significantly anxious which I think is the culprit. Cardiac workup so far has been unremarkable except for sinus tachycardia and PACs. She did not tolerate higher dose of Toprol due to fatigue and dizziness. I recommend continuing current dose of Toprol and using an extra dose as needed.  I asked her to resume exercise and monitor heart rate during exercise. I asked her not to exceed a heart rate of 150 beats per minute.

## 2012-12-02 NOTE — Assessment & Plan Note (Signed)
It appears that anxiety is playing a significant: Her symptoms. I suggest considering a treatment trial with an SSRI. I will forward this to Dr. Felicity Coyer.

## 2012-12-02 NOTE — Patient Instructions (Addendum)
Your physician wants you to follow-up in: 1 year. You will receive a reminder letter in the mail two months in advance. If you don't receive a letter, please call our office to schedule the follow-up appointment.  

## 2012-12-02 NOTE — Progress Notes (Signed)
HPI  This is a 47 year old pleasant female who is here today for a followup visit regarding palpitations due to documented PACs. She had an echocardiogram done in 2012 and 2014 which showed normal LV systolic function without significant structural or valvular abnormalities. She had a Holter monitor done which showed few PACs without any other significant arrhythmia. She had sinus tachycardia with slightly elevated mean average heart rate. The patient's symptoms seems to correlate mostly with anxiety. She underwent a treadmill stress test at that time which showed no evidence of ischemia. She was started on Toprol 25 mg once daily. Her symptoms improved but she continued to have tachycardia. Thus, I proceeded with a 14 day outpatient telemetry which basically showed frequent PACs with no other significant arrhythmia. Her symptoms of palpitations correlated with PACs.  She had transient vision loss few months ago with negative workup including MRI and carotid Doppler. She was thought to have occular migraine. She continues to complain of frequent palpitations and fast heart beats. She gets extremely nervous when she feels a skipped beat. She stopped exercising because it was associated with palpitations.  Allergies  Allergen Reactions  . Latex     rash     Current Outpatient Prescriptions on File Prior to Visit  Medication Sig Dispense Refill  . metoprolol succinate (TOPROL-XL) 25 MG 24 hr tablet Take 1 tablet (25 mg total) by mouth daily.  30 tablet  3   No current facility-administered medications on file prior to visit.     Past Medical History  Diagnosis Date  . Palpitations     sinus tach and PACs on holter and normal echo 02/2010 , no ischemia ETT 05/2011  . GERD (gastroesophageal reflux disease)   . Anxiety   . Elevated blood pressure     hx  . Depression   . HYPERLIPIDEMIA, MILD     not on medication  . MORTON'S NEUROMA, LEFT   . TESTOSTERONE DEFICIENCY      Past  Surgical History  Procedure Laterality Date  . Cesarean section  5/91, 6/93, 7/02  . Tubal ligation       Family History  Problem Relation Age of Onset  . Hypertension Father   . Hypertension Brother      History   Social History  . Marital Status: Married    Spouse Name: N/A    Number of Children: 3  . Years of Education: N/A   Occupational History  . Environmental health practitioner    Social History Main Topics  . Smoking status: Never Smoker   . Smokeless tobacco: Never Used     Comment: Married, lives with spouse. works part time as Diplomatic Services operational officer  . Alcohol Use: No     Comment: Denies alcohol use.   . Drug Use: No  . Sexual Activity: Yes    Partners: Male    Birth Control/ Protection: Surgical     Comment: BTL   Other Topics Concern  . Not on file   Social History Narrative  . No narrative on file      PHYSICAL EXAM   BP 125/90  Pulse 110  Ht 5\' 8"  (1.727 m)  Wt 196 lb 4 oz (89.018 kg)  BMI 29.85 kg/m2  Constitutional: She is oriented to person, place, and time. She appears well-developed and well-nourished. No distress.  HENT: No nasal discharge.  Head: Normocephalic and atraumatic.  Eyes: Pupils are equal and round. Right eye exhibits no discharge. Left eye exhibits no discharge.  Neck: Normal range of motion. Neck supple. No JVD present. No thyromegaly present.  Cardiovascular: Normal rate, regular rhythm, normal heart sounds. Exam reveals no gallop and no friction rub. No murmur heard.  Pulmonary/Chest: Effort normal and breath sounds normal. No stridor. No respiratory distress. She has no wheezes. She has no rales. She exhibits no tenderness.  Abdominal: Soft. Bowel sounds are normal. She exhibits no distension. There is no tenderness. There is no rebound and no guarding.  Musculoskeletal: Normal range of motion. She exhibits no edema and no tenderness.  Neurological: She is alert and oriented to person, place, and time. Coordination normal.  Skin: Skin  is warm and dry. No rash noted. She is not diaphoretic. No erythema. No pallor.  Psychiatric: She has a normal mood and affect. Her behavior is normal. Judgment and thought content normal.    EKG: Sinus  Tachycardia  -Short PR PRi = 118  Borderline   ASSESSMENT AND PLAN

## 2012-12-03 ENCOUNTER — Encounter: Payer: Self-pay | Admitting: Internal Medicine

## 2012-12-04 ENCOUNTER — Telehealth: Payer: Self-pay | Admitting: *Deleted

## 2012-12-04 MED ORDER — ALPRAZOLAM 0.25 MG PO TABS: 0.2500 mg | ORAL_TABLET | Freq: Two times a day (BID) | ORAL | Status: DC | PRN

## 2012-12-04 MED ORDER — PAROXETINE HCL 10 MG PO TABS: 10.0000 mg | ORAL_TABLET | Freq: Every day | ORAL | Status: DC

## 2012-12-04 NOTE — Telephone Encounter (Signed)
Patient called and PCP put her on an asprin a day. Dr. Kirke Corin took her off the asprin and she isn't sure if she should gradually come off it or just stop all together. Please advise

## 2012-12-04 NOTE — Telephone Encounter (Signed)
Pt called and said Dr Kirke Corin told her to stop aspirin and she wanted to make sure she did not have to taper off. After conferring with Dr Mariah Milling I  informed her it was ok to stop without tapering.

## 2012-12-17 ENCOUNTER — Encounter: Payer: Self-pay | Admitting: Internal Medicine

## 2012-12-17 ENCOUNTER — Ambulatory Visit (INDEPENDENT_AMBULATORY_CARE_PROVIDER_SITE_OTHER): Payer: BC Managed Care – PPO | Admitting: Internal Medicine

## 2012-12-17 ENCOUNTER — Other Ambulatory Visit (INDEPENDENT_AMBULATORY_CARE_PROVIDER_SITE_OTHER): Payer: BC Managed Care – PPO

## 2012-12-17 VITALS — BP 140/90 | HR 82 | Temp 98.1°F | Resp 18 | Wt 192.1 lb

## 2012-12-17 DIAGNOSIS — R5383 Other fatigue: Secondary | ICD-10-CM

## 2012-12-17 DIAGNOSIS — E785 Hyperlipidemia, unspecified: Secondary | ICD-10-CM

## 2012-12-17 DIAGNOSIS — IMO0001 Reserved for inherently not codable concepts without codable children: Secondary | ICD-10-CM

## 2012-12-17 DIAGNOSIS — R5381 Other malaise: Secondary | ICD-10-CM

## 2012-12-17 DIAGNOSIS — M791 Myalgia, unspecified site: Secondary | ICD-10-CM

## 2012-12-17 DIAGNOSIS — E538 Deficiency of other specified B group vitamins: Secondary | ICD-10-CM | POA: Insufficient documentation

## 2012-12-17 DIAGNOSIS — F329 Major depressive disorder, single episode, unspecified: Secondary | ICD-10-CM

## 2012-12-17 HISTORY — DX: Deficiency of other specified B group vitamins: E53.8

## 2012-12-17 LAB — CBC WITH DIFFERENTIAL/PLATELET
Basophils Absolute: 0 10*3/uL (ref 0.0–0.1)
Basophils Relative: 0.3 % (ref 0.0–3.0)
Eosinophils Absolute: 0.1 10*3/uL (ref 0.0–0.7)
Eosinophils Relative: 0.5 % (ref 0.0–5.0)
HCT: 41.8 % (ref 36.0–46.0)
Hemoglobin: 14.3 g/dL (ref 12.0–15.0)
Lymphocytes Relative: 13.3 % (ref 12.0–46.0)
Lymphs Abs: 1.5 10*3/uL (ref 0.7–4.0)
MCHC: 34.2 g/dL (ref 30.0–36.0)
MCV: 89.4 fl (ref 78.0–100.0)
Monocytes Absolute: 0.6 10*3/uL (ref 0.1–1.0)
Monocytes Relative: 5.1 % (ref 3.0–12.0)
Neutro Abs: 9 10*3/uL — ABNORMAL HIGH (ref 1.4–7.7)
Neutrophils Relative %: 80.8 % — ABNORMAL HIGH (ref 43.0–77.0)
Platelets: 282 10*3/uL (ref 150.0–400.0)
RBC: 4.67 Mil/uL (ref 3.87–5.11)
RDW: 12.3 % (ref 11.5–14.6)
WBC: 11.2 10*3/uL — ABNORMAL HIGH (ref 4.5–10.5)

## 2012-12-17 LAB — HEPATIC FUNCTION PANEL
ALT: 23 U/L (ref 0–35)
AST: 22 U/L (ref 0–37)
Albumin: 4.8 g/dL (ref 3.5–5.2)
Alkaline Phosphatase: 60 U/L (ref 39–117)
Bilirubin, Direct: 0.1 mg/dL (ref 0.0–0.3)
Total Bilirubin: 0.8 mg/dL (ref 0.3–1.2)
Total Protein: 7.7 g/dL (ref 6.0–8.3)

## 2012-12-17 LAB — BASIC METABOLIC PANEL
BUN: 16 mg/dL (ref 6–23)
CO2: 25 mEq/L (ref 19–32)
Calcium: 9.3 mg/dL (ref 8.4–10.5)
Chloride: 102 mEq/L (ref 96–112)
Creatinine, Ser: 0.7 mg/dL (ref 0.4–1.2)
GFR: 96.19 mL/min (ref >60.00)
Glucose, Bld: 100 mg/dL — ABNORMAL HIGH (ref 70–99)
Potassium: 4.1 mEq/L (ref 3.5–5.1)
Sodium: 135 mEq/L (ref 135–145)

## 2012-12-17 LAB — LIPID PANEL
Cholesterol: 186 mg/dL (ref 0–200)
HDL: 38.4 mg/dL — ABNORMAL LOW (ref >39.00)
LDL Cholesterol: 128 mg/dL — ABNORMAL HIGH (ref 0–99)
Total CHOL/HDL Ratio: 5
Triglycerides: 99 mg/dL (ref 0.0–149.0)
VLDL: 19.8 mg/dL (ref 0.0–40.0)

## 2012-12-17 LAB — TSH: TSH: 1.39 u[IU]/mL (ref 0.35–5.50)

## 2012-12-17 LAB — CK: Total CK: 63 U/L (ref 7–177)

## 2012-12-17 LAB — SEDIMENTATION RATE: Sed Rate: 12 mm/hr (ref 0–22)

## 2012-12-17 LAB — VITAMIN B12: Vitamin B-12: 250 pg/mL (ref 211–911)

## 2012-12-17 MED ORDER — PAROXETINE HCL 20 MG PO TABS: 20.0000 mg | ORAL_TABLET | Freq: Every day | ORAL | Status: DC

## 2012-12-17 NOTE — Assessment & Plan Note (Signed)
Hx same, overlap with anxiety Began Paxil 12/04/12 - titrate up now we reviewed potential risk/benefit and possible side effects - pt understands and agrees to same

## 2012-12-17 NOTE — Progress Notes (Signed)
Subjective:    Patient ID: Alyssa Pratt, female    DOB: 1965-02-14, 47 y.o.   MRN: [id number]  HPI Here for concern about fatigue x 2-3 weeks  Also reviewed chronic medical issues:  Palpitations, Depression/anxiety,GERD   Past Medical History  Diagnosis Date  . Palpitations     sinus tach and PACs on holter and normal echo 02/2010 , no ischemia ETT 05/2011  . GERD (gastroesophageal reflux disease)   . Anxiety   . Elevated blood pressure     hx  . Depression   . HYPERLIPIDEMIA, MILD     not on medication  . MORTON'S NEUROMA, LEFT   . TESTOSTERONE DEFICIENCY     Review of Systems  Constitutional: Positive for fatigue. Negative for diaphoresis.  Respiratory: Negative for wheezing.   Cardiovascular: Negative for chest pain.  Genitourinary: Negative for dysuria and flank pain.  Musculoskeletal: Negative for back pain, gait problem and joint swelling.  Neurological: Negative for numbness.  Hematological: Does not bruise/bleed easily.       Objective:   Physical Exam BP 140/90  Pulse 82  Temp(Src) 98.1 F (36.7 C) (Oral)  Resp 18  Wt 192 lb 1.9 oz (87.145 kg)  SpO2 98% Wt Readings from Last 3 Encounters:  12/17/12 192 lb 1.9 oz (87.145 kg)  12/02/12 196 lb 4 oz (89.018 kg)  10/29/12 193 lb 9.6 oz (87.816 kg)   Constitutional: She appears well-developed and well-nourished. No distress. Neck: Normal range of motion. Neck supple. No JVD present. No thyromegaly present.  Cardiovascular: Normal rate, regular rhythm and normal heart sounds.  No murmur heard. No BLE edema. Pulmonary/Chest: Effort normal and breath sounds normal. No respiratory distress. She has no wheezes.  Psychiatric: She has an apathetic mood and affect. Her behavior is normal. Judgment and thought content normal.       Lab Results  Component Value Date   WBC 5.5 03/26/2012   HGB 13.5 03/26/2012   HCT 40.2 03/26/2012   PLT 267.0 03/26/2012   CHOL 158 11/09/2009   TRIG 97.0 11/09/2009   HDL 36.40*  11/09/2009   ALT 16 03/26/2012   AST 12 03/26/2012   NA 139 03/26/2012   K 4.0 03/26/2012   CL 106 03/26/2012   CREATININE 0.6 03/26/2012   BUN 14 03/26/2012   CO2 28 03/26/2012   TSH 1.60 03/26/2012   Mr Brain Wo Contrast  03/28/2012   *RADIOLOGY REPORT*  Clinical Data:  Transient vision disturbance  MRI HEAD WITHOUT CONTRAST MRA HEAD WITHOUT CONTRAST  Technique:  Multiplanar, multiecho pulse sequences of the brain and surrounding structures were obtained without intravenous contrast. Angiographic images of the head were obtained using MRA technique without contrast.  Comparison:   None  MRI HEAD  Findings:  Negative for acute infarct.  No significant chronic ischemic changes are present.  Cerebral white matter is normal. Brainstem is normal.  Negative for demyelinating disease.  Ventricle size is normal.  Negative for mass lesion.  No intracranial hemorrhage is present.  No brain edema.  Negative for fluid collection.  Pituitary gland is normal in size.  Orbit is grossly normal.  IMPRESSION: Negative  MRA HEAD  Findings: Both vertebral arteries are normal.  PICA is patent bilaterally.  Basilar is widely patent.  Superior cerebellar and posterior cerebral arteries are normal.  Internal carotid artery is patent bilaterally with mild irregularity in the cavernous segment felt to be artifact.  The anterior and middle cerebral arteries are normal.  Negative for cerebral aneurysm.  IMPRESSION: Normal   Original Report Authenticated By: Janeece Riggers, M.D.   Mr Mra Head/brain Wo Cm  03/28/2012   *RADIOLOGY REPORT*  Clinical Data:  Transient vision disturbance  MRI HEAD WITHOUT CONTRAST MRA HEAD WITHOUT CONTRAST  MRI HEAD  Findings:  Negative for acute infarct.  No significant chronic ischemic changes are present.  Cerebral white matter is normal. Brainstem is normal.  Negative for demyelinating disease.  Ventricle size is normal.  Negative for mass lesion.  No intracranial hemorrhage is present.  No brain edema.   Negative for fluid collection.  Pituitary gland is normal in size.  Orbit is grossly normal.  IMPRESSION: Negative  MRA HEAD  Findings: Both vertebral arteries are normal.  PICA is patent bilaterally.  Basilar is widely patent.  Superior cerebellar and posterior cerebral arteries are normal.  Internal carotid artery is patent bilaterally with mild irregularity in the cavernous segment felt to be artifact.  The anterior and middle cerebral arteries are normal.  Negative for cerebral aneurysm.  IMPRESSION: Normal   Original Report Authenticated By: Janeece Riggers, M.D.   Assessment & Plan:   Fatigue - nonspecific symptoms/exam, ?SAD or deprssion - check screening labs and see below  Also See problem list. Medications and labs reviewed today.

## 2012-12-17 NOTE — Assessment & Plan Note (Signed)
Never on meds The patient is asked to make an attempt to improve diet and exercise patterns to aid in medical management of this problem. Check annually

## 2012-12-17 NOTE — Progress Notes (Signed)
Pre-visit discussion using our clinic review tool. No additional management support is needed unless otherwise documented below in the visit note.  

## 2012-12-17 NOTE — Patient Instructions (Addendum)
It was good to see you today.  We have reviewed your prior records including labs and tests today  Test(s) ordered today. Your results will be released to MyChart (or called to you) after review, usually within 72hours after test completion. If any changes need to be made, you will be notified at that same time.  Medications reviewed and updated, increase Paxil to 20mg  daily - no other changes recommended at this time.  Your prescription(s) have been submitted to your pharmacy. Please take as directed and contact our office if you believe you are having problem(s) with the medication(s).  Please schedule followup in 3-4 months, call sooner if problems.  Fatigue Fatigue is a feeling of tiredness, lack of energy, lack of motivation, or feeling tired all the time. Having enough rest, good nutrition, and reducing stress will normally reduce fatigue. Consult your caregiver if it persists. The nature of your fatigue will help your caregiver to find out its cause. The treatment is based on the cause.  CAUSES  There are many causes for fatigue. Most of the time, fatigue can be traced to one or more of your habits or routines. Most causes fit into one or more of three general areas. They are: Lifestyle problems  Sleep disturbances.  Overwork.  Physical exertion.  Unhealthy habits.  Poor eating habits or eating disorders.  Alcohol and/or drug use .  Lack of proper nutrition (malnutrition). Psychological problems  Stress and/or anxiety problems.  Depression.  Grief.  Boredom. Medical Problems or Conditions  Anemia.  Pregnancy.  Thyroid gland problems.  Recovery from major surgery.  Continuous pain.  Emphysema or asthma that is not well controlled  Allergic conditions.  Diabetes.  Infections (such as mononucleosis).  Obesity.  Sleep disorders, such as sleep apnea.  Heart failure or other heart-related problems.  Cancer.  Kidney disease.  Liver  disease.  Effects of certain medicines such as antihistamines, cough and cold remedies, prescription pain medicines, heart and blood pressure medicines, drugs used for treatment of cancer, and some antidepressants. SYMPTOMS  The symptoms of fatigue include:   Lack of energy.  Lack of drive (motivation).  Drowsiness.  Feeling of indifference to the surroundings. DIAGNOSIS  The details of how you feel help guide your caregiver in finding out what is causing the fatigue. You will be asked about your present and past health condition. It is important to review all medicines that you take, including prescription and non-prescription items. A thorough exam will be done. You will be questioned about your feelings, habits, and normal lifestyle. Your caregiver may suggest blood tests, urine tests, or other tests to look for common medical causes of fatigue.  TREATMENT  Fatigue is treated by correcting the underlying cause. For example, if you have continuous pain or depression, treating these causes will improve how you feel. Similarly, adjusting the dose of certain medicines will help in reducing fatigue.  HOME CARE INSTRUCTIONS   Try to get the required amount of good sleep every night.  Eat a healthy and nutritious diet, and drink enough water throughout the day.  Practice ways of relaxing (including yoga or meditation).  Exercise regularly.  Make plans to change situations that cause stress. Act on those plans so that stresses decrease over time. Keep your work and personal routine reasonable.  Avoid street drugs and minimize use of alcohol.  Start taking a daily multivitamin after consulting your caregiver. SEEK MEDICAL CARE IF:   You have persistent tiredness, which cannot be accounted for.  You have fever.  You have unintentional weight loss.  You have headaches.  You have disturbed sleep throughout the night.  You are feeling sad.  You have constipation.  You have dry  skin.  You have gained weight.  You are taking any new or different medicines that you suspect are causing fatigue.  You are unable to sleep at night.  You develop any unusual swelling of your legs or other parts of your body. SEEK IMMEDIATE MEDICAL CARE IF:   You are feeling confused.  Your vision is blurred.  You feel faint or pass out.  You develop severe headache.  You develop severe abdominal, pelvic, or back pain.  You develop chest pain, shortness of breath, or an irregular or fast heartbeat.  You are unable to pass a normal amount of urine.  You develop abnormal bleeding such as bleeding from the rectum or you vomit blood.  You have thoughts about harming yourself or committing suicide.  You are worried that you might harm someone else. MAKE SURE YOU:   Understand these instructions.  Will watch your condition.  Will get help right away if you are not doing well or get worse. Document Released: 10/16/2006 Document Revised: 03/13/2011 Document Reviewed: 10/16/2006 [facility] Patient Information 2014 [place], [place].

## 2012-12-18 ENCOUNTER — Encounter: Payer: Self-pay | Admitting: Internal Medicine

## 2012-12-18 ENCOUNTER — Telehealth: Payer: Self-pay | Admitting: *Deleted

## 2012-12-18 LAB — VITAMIN D 25 HYDROXY (VIT D DEFICIENCY, FRACTURES): Vit D, 25-Hydroxy: 17 ng/mL — ABNORMAL LOW (ref 30–89)

## 2012-12-18 NOTE — Telephone Encounter (Signed)
Called pt to make nurse visit for B12 injections. Pt states she change her mind she would rather take the b12 over counter. She has bought  B12 2500 mcg and had started today...Alyssa Pratt

## 2012-12-18 NOTE — Telephone Encounter (Signed)
Called pt she has change her mind about starting the b12 injection. She has started the b12 2500 mcg over the counter. See msg that was sent previously to md.../lmb

## 2012-12-18 NOTE — Telephone Encounter (Signed)
Alyssa Pratt - please schedule nurse visit for B12 shot q 30d

## 2012-12-19 ENCOUNTER — Encounter: Payer: Self-pay | Admitting: Internal Medicine

## 2012-12-19 ENCOUNTER — Ambulatory Visit: Payer: BC Managed Care – PPO | Admitting: Cardiovascular Disease

## 2012-12-19 ENCOUNTER — Telehealth: Payer: Self-pay | Admitting: Internal Medicine

## 2012-12-19 MED ORDER — PAROXETINE HCL 20 MG PO TABS: 10.0000 mg | ORAL_TABLET | Freq: Every day | ORAL | Status: DC

## 2012-12-19 NOTE — Telephone Encounter (Signed)
Called pt to get further detail. Pt states she was taking 1/2 of paxil for 1 week, instructed to take whole pill today. Think med is too strong she is feeling even more tired than she was before. Suggest pt to just take 1/2 of paxil for another week or 2 and if she still same call back and let md know...lmb

## 2012-12-19 NOTE — Telephone Encounter (Signed)
Noted -  Dose ok orally - i updated med list thanks

## 2012-12-19 NOTE — Telephone Encounter (Signed)
Thanks for the message. Agree patient should remain on 10 mg daily at this time

## 2012-12-19 NOTE — Telephone Encounter (Signed)
12/19/2012   Pt called stating that the RX PARoxetine (PAXIL) 20 MG tablet is too much for them.  They are wanting something a little more mild.  Please contact pt

## 2013-03-10 ENCOUNTER — Ambulatory Visit: Payer: BC Managed Care – PPO | Admitting: Podiatry

## 2013-03-17 ENCOUNTER — Ambulatory Visit: Payer: BC Managed Care – PPO | Admitting: Internal Medicine

## 2013-03-18 ENCOUNTER — Other Ambulatory Visit: Payer: Self-pay | Admitting: *Deleted

## 2013-03-18 MED ORDER — METOPROLOL SUCCINATE ER 25 MG PO TB24: 25.0000 mg | ORAL_TABLET | Freq: Every day | ORAL | Status: DC

## 2013-03-24 ENCOUNTER — Encounter: Payer: Self-pay | Admitting: Internal Medicine

## 2013-03-24 ENCOUNTER — Ambulatory Visit (INDEPENDENT_AMBULATORY_CARE_PROVIDER_SITE_OTHER): Payer: BC Managed Care – PPO | Admitting: Internal Medicine

## 2013-03-24 VITALS — BP 112/82 | HR 85 | Temp 98.3°F | Wt 200.1 lb

## 2013-03-24 DIAGNOSIS — M79609 Pain in unspecified limb: Secondary | ICD-10-CM

## 2013-03-24 DIAGNOSIS — R002 Palpitations: Secondary | ICD-10-CM

## 2013-03-24 DIAGNOSIS — M79675 Pain in left toe(s): Secondary | ICD-10-CM

## 2013-03-24 DIAGNOSIS — F411 Generalized anxiety disorder: Secondary | ICD-10-CM

## 2013-03-24 MED ORDER — PAROXETINE HCL 20 MG PO TABS: 10.0000 mg | ORAL_TABLET | Freq: Every day | ORAL | Status: DC

## 2013-03-24 MED ORDER — PAROXETINE HCL 10 MG PO TABS: 10.0000 mg | ORAL_TABLET | Freq: Every day | ORAL | Status: DC

## 2013-03-24 NOTE — Patient Instructions (Signed)
It was good to see you today.  We have reviewed your prior records including labs and tests today  Medications reviewed and updated, no changes recommended at this time.  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

## 2013-03-24 NOTE — Progress Notes (Signed)
Pre visit review using our clinic review tool, if applicable. No additional management support is needed unless otherwise documented below in the visit note. 

## 2013-03-24 NOTE — Assessment & Plan Note (Signed)
Hx same, overlap with anxiety Began Paxil 12/04/12 - poor tolerance to higher dose titration trial symptoms improved - continue same

## 2013-03-24 NOTE — Progress Notes (Signed)
Subjective:    Patient ID: Alyssa Pratt, female    DOB: [birth year], 48 y.o.   MRN: [id number]  HPI  Patient seen today for follow up. Chronic medical issues reviewed -  Anxiety - previous trials of lexapro and wellbutrin with poor medication tolerance with sx of fatigue, libido suppressioon and uncontrolled anxiety sx.  Started on Paxil 12/2012 with better control of symptoms. Did not tolerate 20mg  dosing due to side effects.   Palpitations - extensive cardiac evaluation including 2-D echo February 2012 with normal LVEF and no structural or valvular abnormalities. Negative Holter monitor and 14 day ECG monitor ( sinus tach with PACs). Also treadmill stress test May 2013 negative for ischemia - on beta blocker for efforts at controlling same.  Corresponds worsening palpitations with hormonal changes around time of her cycle - she is tolerating currently and not interested in change in therapy.   Also c/o left great toe pain - started several weeks ago. Numbness of lateral aspect of left great toe with throbbing pain at MTS joint radiating into medial aspect of left great toe.  Evaluated by ortho who obtained MRI - normal.  She was prescribed a round of prednisone which gave temporal relief.  No injury or other precipitating factors.   Past Medical History  Diagnosis Date  . Palpitations     sinus tach and PACs on holter and normal echo 02/2010 , no ischemia ETT 05/2011  . GERD (gastroesophageal reflux disease)   . Anxiety   . Elevated blood pressure     hx  . Depression   . HYPERLIPIDEMIA, MILD     not on medication  . MORTON'S NEUROMA, LEFT   . TESTOSTERONE DEFICIENCY   . Vitamin B12 deficiency 12/17/2012    Lab Results Component Value Date  VITAMINB12 250 12/17/2012      Review of Systems  Constitutional: Negative for fever, chills, activity change and appetite change.  HENT: Negative.   Respiratory: Negative for cough, chest tightness, shortness of breath and wheezing.     Cardiovascular: Negative for chest pain, palpitations and leg swelling.  Gastrointestinal: Negative for nausea, vomiting, abdominal pain, diarrhea, constipation and abdominal distention.  Musculoskeletal:       Left great toe pain and numbness  Skin: Negative for rash and wound.  Neurological: Negative for dizziness, syncope, weakness and headaches.  Psychiatric/Behavioral: Negative for sleep disturbance. The patient is not nervous/anxious.        Objective:   Physical Exam  Constitutional: She is oriented to person, place, and time. She appears well-developed and well-nourished. No distress.  Neck: Normal range of motion. Neck supple. No thyromegaly present.  Cardiovascular: Normal rate, regular rhythm and normal heart sounds.   No murmur heard. Pulmonary/Chest: Effort normal and breath sounds normal. No respiratory distress. She has no wheezes.  Musculoskeletal: Normal range of motion. She exhibits no edema and no tenderness.  Lymphadenopathy:    She has no cervical adenopathy.  Neurological: She is alert and oriented to person, place, and time.  Skin: Skin is warm and dry. No rash noted. She is not diaphoretic.  Psychiatric: She has a normal mood and affect. Her behavior is normal. Judgment and thought content normal.     Wt Readings from Last 3 Encounters:  03/24/13 200 lb 1.9 oz (90.774 kg)  12/17/12 192 lb 1.9 oz (87.145 kg)  12/02/12 196 lb 4 oz (89.018 kg)   BP Readings from Last 3 Encounters:  03/24/13 112/82  12/17/12 140/90  12/02/12 125/90  Lab Results  Component Value Date   WBC 11.2* 12/17/2012   HGB 14.3 12/17/2012   HCT 41.8 12/17/2012   PLT 282.0 12/17/2012   GLUCOSE 100* 12/17/2012   CHOL 186 12/17/2012   TRIG 99.0 12/17/2012   HDL 38.40* 12/17/2012   LDLCALC 128* 12/17/2012   ALT 23 12/17/2012   AST 22 12/17/2012   NA 135 12/17/2012   K 4.1 12/17/2012   CL 102 12/17/2012   CREATININE 0.7 12/17/2012   BUN 16 12/17/2012   CO2 25 12/17/2012    TSH 1.39 12/17/2012       Assessment & Plan:   Problem List Items Addressed This Visit   Anxiety state, unspecified - Primary     Hx same, overlap with anxiety Began Paxil 12/04/12 - poor tolerance to higher dose titration trial symptoms improved - continue same    Relevant Medications      PARoxetine (PAXIL) tablet   Heart palpitations     Palpitations related to anxiety, controlled with current dose of beta blocker Follows with cardiology for same No med changes recommended     Other Visit Diagnoses   Pain of left great toe          Suspect L toe pain related to compressive neuropathy. Has been evaluated by orthopedics and reviewed report negative MRI evaluation from earlier this year for same. Has improved with course of prednisone by ortho but recurrent when done. I recommend over-the-counter Aleve twice daily for 2 weeks, change in footwear and consideration of podiatry or sports medicine eval for new shoes support to reduce compression and subsequent symptoms   Time spent with pt today 25 minutes, greater than 50% time spent counseling patient on anxiety/depression and medication review. Also review of prior records

## 2013-03-24 NOTE — Assessment & Plan Note (Signed)
Palpitations related to anxiety, controlled with current dose of beta blocker Follows with cardiology for same No med changes recommended

## 2013-05-01 ENCOUNTER — Encounter: Payer: Self-pay | Admitting: Internal Medicine

## 2013-05-01 DIAGNOSIS — M79676 Pain in unspecified toe(s): Secondary | ICD-10-CM

## 2013-05-05 NOTE — Telephone Encounter (Signed)
Pt sent email on Thurs 05/01/13 wanting md recommendations...Raechel Chute/lmb

## 2013-05-05 NOTE — Telephone Encounter (Signed)
Refer to podiatry - done Send my chart message on same

## 2013-06-04 ENCOUNTER — Ambulatory Visit: Payer: BC Managed Care – PPO | Admitting: Podiatry

## 2013-06-11 ENCOUNTER — Ambulatory Visit (INDEPENDENT_AMBULATORY_CARE_PROVIDER_SITE_OTHER): Payer: BC Managed Care – PPO | Admitting: Podiatry

## 2013-06-11 ENCOUNTER — Encounter: Payer: Self-pay | Admitting: Podiatry

## 2013-06-11 VITALS — BP 120/83 | HR 77 | Ht 68.0 in | Wt 200.0 lb

## 2013-06-11 DIAGNOSIS — M7740 Metatarsalgia, unspecified foot: Secondary | ICD-10-CM

## 2013-06-11 DIAGNOSIS — M7750 Other enthesopathy of unspecified foot: Secondary | ICD-10-CM

## 2013-06-11 DIAGNOSIS — M216X9 Other acquired deformities of unspecified foot: Secondary | ICD-10-CM | POA: Insufficient documentation

## 2013-06-11 NOTE — Patient Instructions (Signed)
Seen for left foot first MPJ pain. Noted of weakened first ray, with excess motion.  May benefit from custom orthotics.

## 2013-06-11 NOTE — Progress Notes (Signed)
Subjective: 48 year old female presents complaining of having pain at the left great toe joint area. Stated that she has had an episode 3 years ago, and pain has been coming back off and on. Patient points first MPJ area of left great toe joint painful. On feet about not much. Work in Health and safety inspector job. Mostly wears flat dress shoes.  Patient brought old MRI report which stated normal findings other tan bi partated sesamoid bone left foot.   Review of Systems - General ROS: negative for - chills, fatigue, fever, night sweats, sleep disturbance, weight gain or weight loss Ophthalmic ROS: negative ENT ROS: negative Allergy and Immunology ROS: negative Breast ROS: negative for breast lumps Respiratory ROS: no cough, shortness of breath, or wheezing Cardiovascular ROS: Has extra heart beat and takes Toprol. Being monitored. Gastrointestinal ROS: no abdominal pain, change in bowel habits, or black or bloody stools Genito-Urinary ROS: no dysuria, trouble voiding, or hematuria Musculoskeletal ROS: Only foot pain. Neurological ROS: no TIA or stroke symptoms Dermatological ROS: negative.  Objective: Dermatologic: No abnormal skin lesions. Vascular: All pedal pulses are palpable. No edema or erythema noted. Neurologic: Subjective occasional numbness along the medial aspect of the great toe left.  Orthopedic: Varus positioned heel bone with high arched foot bilateral. Slight increased sagittal plane motion of the first ray left foot compared with the contralateral side.  Increased STJ pronation left.  Assessment: Metatarsalgia first ray left. Rearfoot varus and cavus foot. Compensatory STJ excess pronation left. Weakened first ray left, excess sagittal plane motion of the first ray left.  Plan: Reviewed the findings and available treatment options, custom orthotics to limit compensatory excess pronation of the STJ left foot.  Patient will return for orthotics.

## 2013-06-16 ENCOUNTER — Encounter: Payer: Self-pay | Admitting: Podiatry

## 2013-06-16 ENCOUNTER — Ambulatory Visit (INDEPENDENT_AMBULATORY_CARE_PROVIDER_SITE_OTHER): Payer: BC Managed Care – PPO | Admitting: Podiatry

## 2013-06-16 VITALS — BP 112/77 | HR 81 | Ht 68.0 in | Wt 200.0 lb

## 2013-06-16 DIAGNOSIS — M216X9 Other acquired deformities of unspecified foot: Secondary | ICD-10-CM

## 2013-06-16 DIAGNOSIS — M7740 Metatarsalgia, unspecified foot: Secondary | ICD-10-CM

## 2013-06-16 DIAGNOSIS — M7750 Other enthesopathy of unspecified foot: Secondary | ICD-10-CM

## 2013-06-16 NOTE — Progress Notes (Signed)
Subjective:  48 year old female presents to have orthotics prepared.  Been hurting at first ray from toe to ankle area.   Objective: No change in findings. Dermatologic: No abnormal skin lesions.  Vascular: All pedal pulses are palpable. No edema or erythema noted.  Neurologic: Subjective occasional numbness along the medial aspect of the great toe left.  Orthopedic: Varus positioned heel bone with high arched foot bilateral.  Increased sagittal plane motion of the first ray left foot compared with the contralateral side.   Radiographic examination reveal normal AP view with minimum change in lateral deviation angle of Calcaneocuboid joint. On lateral view, severe dorsal displacement of the first ray, posterior calcaneal spur, in cavus type high instep foot.   Assessment: Metatarsalgia first ray left.  Rearfoot varus and cavus foot.  Compensatory STJ excess pronation left.  Weakened first ray left, excess sagittal plane motion of the first ray left.   Plan: Both feet casted for Orthotics. Metatarsal binder dispensed x 1.

## 2013-06-16 NOTE — Patient Instructions (Addendum)
Both feet casted for orthotics.  May benefit wearing metatarsal binder. Will contact patient when orthotics are ready.

## 2013-07-16 ENCOUNTER — Other Ambulatory Visit: Payer: Self-pay | Admitting: Cardiovascular Disease

## 2013-07-18 ENCOUNTER — Telehealth: Payer: Self-pay | Admitting: Obstetrics & Gynecology

## 2013-07-18 NOTE — Telephone Encounter (Signed)
Patient states she has been having 4 weeks of lower back pain and LLQ pain. She states she saw her orthopedist as she thought she was having musculoskeletal pain. Had negative xrays but was given prednisone for inflammation. Patient describes pain as aching and is better with walking or laying down, sitting down makes pain worse. Denies vaginal bleeding, discharge, dysuria. Having regular BM.   Advised Dr. Hyacinth MeekerMiller not in office next week. Requests to see Dr. Eustace QuailSilva-offered first available appointment with Dr. Edward JollySilva, Wednesday at 0930. Patient is agreeable to this and will call back if symptoms worsen or she decides she would like to be seen by another provider in our office for an earlier appointment.   Routing to provider for final review. Patient agreeable to disposition. Will close encounter

## 2013-07-18 NOTE — Telephone Encounter (Signed)
Patient is having lower back and pelvic pain. Patient was prescribed prednisone with no relief. Patient is asking for an appointment next week with Dr.Miller.

## 2013-07-23 ENCOUNTER — Ambulatory Visit: Payer: BC Managed Care – PPO | Admitting: Obstetrics and Gynecology

## 2013-08-19 ENCOUNTER — Ambulatory Visit: Payer: BC Managed Care – PPO | Admitting: Podiatry

## 2013-10-02 LAB — HM MAMMOGRAPHY

## 2013-10-03 ENCOUNTER — Other Ambulatory Visit: Payer: Self-pay | Admitting: Internal Medicine

## 2013-11-03 ENCOUNTER — Encounter: Payer: Self-pay | Admitting: Podiatry

## 2013-12-01 ENCOUNTER — Encounter: Payer: Self-pay | Admitting: Obstetrics & Gynecology

## 2013-12-01 ENCOUNTER — Ambulatory Visit (INDEPENDENT_AMBULATORY_CARE_PROVIDER_SITE_OTHER): Payer: BC Managed Care – PPO | Admitting: Obstetrics & Gynecology

## 2013-12-01 VITALS — BP 120/68 | HR 72 | Resp 16 | Ht 67.5 in | Wt 204.2 lb

## 2013-12-01 DIAGNOSIS — R312 Other microscopic hematuria: Secondary | ICD-10-CM

## 2013-12-01 DIAGNOSIS — R3129 Other microscopic hematuria: Secondary | ICD-10-CM

## 2013-12-01 DIAGNOSIS — Z01419 Encounter for gynecological examination (general) (routine) without abnormal findings: Secondary | ICD-10-CM

## 2013-12-01 DIAGNOSIS — Z0000 Encounter for general adult medical examination without abnormal findings: Secondary | ICD-10-CM

## 2013-12-01 LAB — POCT URINALYSIS DIPSTICK
Bilirubin, UA: NEGATIVE
Glucose, UA: NEGATIVE
Ketones, UA: NEGATIVE
Leukocytes, UA: NEGATIVE
Nitrite, UA: NEGATIVE
Protein, UA: NEGATIVE
Urobilinogen, UA: NEGATIVE
pH, UA: 7

## 2013-12-01 LAB — HEMOGLOBIN, FINGERSTICK: Hemoglobin, fingerstick: 13.9 g/dL (ref 12.0–16.0)

## 2013-12-01 MED ORDER — NONFORMULARY OR COMPOUNDED ITEM: Status: DC

## 2013-12-01 MED ORDER — NORETHIN-ETH ESTRAD-FE BIPHAS 1 MG-10 MCG / 10 MCG PO TABS: 1.0000 | ORAL_TABLET | Freq: Every day | ORAL | Status: DC

## 2013-12-01 MED ORDER — NORETHINDRONE 0.35 MG PO TABS: 1.0000 | ORAL_TABLET | Freq: Every day | ORAL | Status: DC

## 2013-12-01 NOTE — Progress Notes (Signed)
48 y.o. G3P3 MarriedCaucasianF here for annual exam.  Cycles regular.  Having decreased libido.  Used testosterone injections in the past but had acne and voice change.  PCP:  Dr. Felicity CoyerLeschber.  Cardiologist:  Dr. [name].  Does have issues with PACs and occ tachycardia.  Does have some episodic chest pain.  Is just beneath her sternum.  This is accompanied by being hot and sweaty, flushed.  Pt has undergone a carotid doppler and echo as well as a stress test.  Does have an appt next week.  Does feel like some of these symptoms are menstrual cycle related.    Patient's last menstrual period was 11/10/2013.          Sexually active: Yes.    The current method of family planning is tubal ligation.    Exercising: No.  not regularly Smoker:  no  Health Maintenance: Pap:  10/29/12 WNL/negative HR HPV History of abnormal Pap:  no MMG:  ? 02-23-11-normal Colonoscopy:  2012-repeat in 10 years Dr Loreta AveMann BMD:   none TDaP:  11/11 Screening Labs: 12/14, Hb today: 13.9, Urine today: PH-7.0, RBC-trace   reports that she has never smoked. She has never used smokeless tobacco. She reports that she does not drink alcohol or use illicit drugs.  Past Medical History  Diagnosis Date  . Palpitations     sinus tach and PACs on holter and normal echo 02/2010 , no ischemia ETT 05/2011  . GERD (gastroesophageal reflux disease)   . Anxiety   . Elevated blood pressure     hx  . Depression   . HYPERLIPIDEMIA, MILD     not on medication  . MORTON'S NEUROMA, LEFT   . TESTOSTERONE DEFICIENCY   . Vitamin B12 deficiency 12/17/2012    Lab Results Component Value Date  VITAMINB12 250 12/17/2012      Past Surgical History  Procedure Laterality Date  . Cesarean section  5/91, 6/93, 7/02  . Tubal ligation      Current Outpatient Prescriptions  Medication Sig Dispense Refill  . AFLURIA PRESERVATIVE FREE 0.5 ML SUSY   0  . Cholecalciferol (VITAMIN D) 1000 UNITS capsule Take 1,000 Units by mouth daily.    .  Cyanocobalamin 2500 MCG TABS Take 2,500 mcg by mouth daily.    . metoprolol succinate (TOPROL-XL) 25 MG 24 hr tablet TAKE 1 TABLET ONCE DAILY. 30 tablet 6  . naproxen sodium (ANAPROX) 220 MG tablet Take 220 mg by mouth daily.    Marland Kitchen. PARoxetine (PAXIL) 10 MG tablet TAKE 1 TABLET ONCE DAILY. 90 tablet 0   No current facility-administered medications for this visit.    Family History  Problem Relation Age of Onset  . Hypertension Father   . Hypertension Brother     ROS:  Pertinent items are noted in HPI.  Otherwise, a comprehensive ROS was negative.  Exam:   LMP 11/10/2013       Ht Readings from Last 3 Encounters:  06/16/13 5\' 8"  (1.727 m)  06/11/13 5\' 8"  (1.727 m)  12/02/12 5\' 8"  (1.727 m)    General appearance: alert, cooperative and appears stated age Head: Normocephalic, without obvious abnormality, atraumatic Neck: no adenopathy, supple, symmetrical, trachea midline and thyroid normal to inspection and palpation Lungs: clear to auscultation bilaterally Breasts: normal appearance, no masses or tenderness Heart: regular rate and rhythm Abdomen: soft, non-tender; bowel sounds normal; no masses,  no organomegaly Extremities: extremities normal, atraumatic, no cyanosis or edema Skin: Skin color, texture, turgor normal. No rashes or  lesions Lymph nodes: Cervical, supraclavicular, and axillary nodes normal. No abnormal inguinal nodes palpated Neurologic: Grossly normal   Pelvic: External genitalia:  no lesions              Urethra:  normal appearing urethra with no masses, tenderness or lesions              Bartholins and Skenes: normal                 Vagina: normal appearing vagina with normal color and discharge, no lesions              Cervix: no lesions              Pap taken: No. Bimanual Exam:  Uterus:  normal size, contour, position, consistency, mobility, non-tender              Adnexa: normal adnexa and no mass, fullness, tenderness               Rectovaginal:  Confirms               Anus:  normal sphincter tone, no lesions  A:  Well Woman with normal exam Perimenopausal. Precautions for bleeding changes given. H/O palpitations with normal echo 2012 and 2014.  Pt feels becoming more cycle related. Ocular migraine earlier this year.  P: Mammogram yearly. [facility] before she left office today Pap and HR HPV 11/14.  No pap today. Trial of micronor.  Rx to pharmacy.  Follow up 3 months.  Considering Mirena IUD.  Information given regarding Mirena Trial of topical testosterone 2% cream.  Use two to three times weekly.  #60 grams/1RF.  Follow up 3 month recheck. Labs UTD with Dr. Felicity CoyerLeschber return annually or prn  An After Visit Summary was printed and given to the patient.

## 2013-12-01 NOTE — Addendum Note (Signed)
Addended by: Jerene BearsMILLER, Lorcan Shelp S on: 12/01/2013 05:39 PM   Modules accepted: Orders, SmartSet

## 2013-12-02 NOTE — Addendum Note (Signed)
Addended by: Brooks SailorsHARGROVE, Ligaya Cormier S on: 12/02/2013 09:12 AM   Modules accepted: Kipp BroodSmartSet

## 2013-12-03 LAB — URINALYSIS, MICROSCOPIC ONLY
Casts: NONE SEEN
Crystals: NONE SEEN
Squamous Epithelial / LPF: NONE SEEN

## 2013-12-08 ENCOUNTER — Encounter: Payer: Self-pay | Admitting: *Deleted

## 2013-12-08 ENCOUNTER — Ambulatory Visit: Payer: BC Managed Care – PPO | Admitting: Cardiovascular Disease

## 2013-12-10 ENCOUNTER — Telehealth: Payer: Self-pay | Admitting: Obstetrics & Gynecology

## 2013-12-10 NOTE — Telephone Encounter (Signed)
I filled this out yesterday and it was to be faxed.  1/4 tsp.  Please clarify.

## 2013-12-10 NOTE — Telephone Encounter (Signed)
Incoming fax from [facility] for clarification on Dr. Rondel BatonMiller's desk.   Testosterone - Apply 1/4 ml, or 1/4 teaspoon?   - Called pt we are waiting for Dr. Rondel BatonMiller's response. - pt verbalized understanding.

## 2013-12-10 NOTE — Telephone Encounter (Signed)
[facility] notified. They will call pt when Rx is ready to pick up.

## 2013-12-10 NOTE — Telephone Encounter (Signed)
Pt states her rx cream for testosterone compound was sent to [facility] pharmacy and they have received it but are waiting on Dr Hyacinth MeekerMiller to respond regarding dosing.  Pharmacy: [facility]

## 2013-12-18 ENCOUNTER — Ambulatory Visit: Payer: BC Managed Care – PPO | Admitting: Cardiovascular Disease

## 2013-12-30 ENCOUNTER — Telehealth: Payer: Self-pay

## 2013-12-30 ENCOUNTER — Encounter: Payer: Self-pay | Admitting: Internal Medicine

## 2013-12-30 ENCOUNTER — Other Ambulatory Visit: Payer: Self-pay | Admitting: Internal Medicine

## 2013-12-30 DIAGNOSIS — Z0000 Encounter for general adult medical examination without abnormal findings: Secondary | ICD-10-CM

## 2013-12-30 NOTE — Telephone Encounter (Signed)
Pt requested labs before OV.

## 2014-01-01 ENCOUNTER — Other Ambulatory Visit (INDEPENDENT_AMBULATORY_CARE_PROVIDER_SITE_OTHER): Payer: BC Managed Care – PPO

## 2014-01-01 DIAGNOSIS — Z0000 Encounter for general adult medical examination without abnormal findings: Secondary | ICD-10-CM

## 2014-01-01 LAB — LIPID PANEL
Cholesterol: 175 mg/dL (ref 0–200)
HDL: 35.5 mg/dL — ABNORMAL LOW (ref >39.00)
LDL Cholesterol: 123 mg/dL — ABNORMAL HIGH (ref 0–99)
NonHDL: 139.5
Total CHOL/HDL Ratio: 5
Triglycerides: 83 mg/dL (ref 0.0–149.0)
VLDL: 16.6 mg/dL (ref 0.0–40.0)

## 2014-01-01 LAB — HEPATIC FUNCTION PANEL
ALT: 17 U/L (ref 0–35)
AST: 17 U/L (ref 0–37)
Albumin: 4.2 g/dL (ref 3.5–5.2)
Alkaline Phosphatase: 51 U/L (ref 39–117)
Bilirubin, Direct: 0.1 mg/dL (ref 0.0–0.3)
Total Bilirubin: 0.6 mg/dL (ref 0.2–1.2)
Total Protein: 6.9 g/dL (ref 6.0–8.3)

## 2014-01-01 LAB — URINALYSIS, ROUTINE W REFLEX MICROSCOPIC
Bilirubin Urine: NEGATIVE
Hgb urine dipstick: NEGATIVE
Ketones, ur: NEGATIVE
Leukocytes, UA: NEGATIVE
Nitrite: NEGATIVE
Specific Gravity, Urine: 1.015 (ref 1.000–1.030)
Total Protein, Urine: NEGATIVE
Urine Glucose: NEGATIVE
Urobilinogen, UA: 0.2 (ref 0.0–1.0)
pH: 6 (ref 5.0–8.0)

## 2014-01-01 LAB — BASIC METABOLIC PANEL
BUN: 13 mg/dL (ref 6–23)
CO2: 26 mEq/L (ref 19–32)
Calcium: 9.6 mg/dL (ref 8.4–10.5)
Chloride: 104 mEq/L (ref 96–112)
Creatinine, Ser: 0.7 mg/dL (ref 0.4–1.2)
GFR: 95.78 mL/min (ref >60.00)
Glucose, Bld: 86 mg/dL (ref 70–99)
Potassium: 3.9 mEq/L (ref 3.5–5.1)
Sodium: 136 mEq/L (ref 135–145)

## 2014-01-01 LAB — CBC WITH DIFFERENTIAL/PLATELET
Basophils Absolute: 0.1 10*3/uL (ref 0.0–0.1)
Basophils Relative: 0.7 % (ref 0.0–3.0)
Eosinophils Absolute: 0.1 10*3/uL (ref 0.0–0.7)
Eosinophils Relative: 1.9 % (ref 0.0–5.0)
HCT: 39.4 % (ref 36.0–46.0)
Hemoglobin: 13.2 g/dL (ref 12.0–15.0)
Lymphocytes Relative: 26 % (ref 12.0–46.0)
Lymphs Abs: 2 10*3/uL (ref 0.7–4.0)
MCHC: 33.4 g/dL (ref 30.0–36.0)
MCV: 90.7 fl (ref 78.0–100.0)
Monocytes Absolute: 0.6 10*3/uL (ref 0.1–1.0)
Monocytes Relative: 8.4 % (ref 3.0–12.0)
Neutro Abs: 4.8 10*3/uL (ref 1.4–7.7)
Neutrophils Relative %: 63 % (ref 43.0–77.0)
Platelets: 238 10*3/uL (ref 150.0–400.0)
RBC: 4.34 Mil/uL (ref 3.87–5.11)
RDW: 12.7 % (ref 11.5–15.5)
WBC: 7.7 10*3/uL (ref 4.0–10.5)

## 2014-01-01 LAB — TSH: TSH: 3.58 u[IU]/mL (ref 0.35–4.50)

## 2014-01-07 ENCOUNTER — Encounter: Payer: Self-pay | Admitting: Internal Medicine

## 2014-01-07 ENCOUNTER — Ambulatory Visit (INDEPENDENT_AMBULATORY_CARE_PROVIDER_SITE_OTHER): Payer: BLUE CROSS/BLUE SHIELD | Admitting: Internal Medicine

## 2014-01-07 VITALS — BP 100/80 | HR 83 | Temp 97.8°F | Ht 67.5 in | Wt 204.0 lb

## 2014-01-07 DIAGNOSIS — R002 Palpitations: Secondary | ICD-10-CM

## 2014-01-07 DIAGNOSIS — Z0000 Encounter for general adult medical examination without abnormal findings: Secondary | ICD-10-CM

## 2014-01-07 MED ORDER — PAROXETINE HCL 10 MG PO TABS: 10.0000 mg | ORAL_TABLET | Freq: Every day | ORAL | Status: DC

## 2014-01-07 MED ORDER — METOPROLOL SUCCINATE ER 25 MG PO TB24: 25.0000 mg | ORAL_TABLET | Freq: Every day | ORAL | Status: DC

## 2014-01-07 NOTE — Progress Notes (Signed)
Pre visit review using our clinic review tool, if applicable. No additional management support is needed unless otherwise documented below in the visit note. 

## 2014-01-07 NOTE — Progress Notes (Signed)
Subjective:    Patient ID: Alyssa Pratt, female    DOB: [birth year], 49 y.o.   MRN: [id number]  HPI  patient is here today for annual physical. Patient feels well and has no complaints.  Also reviewed chronic medical issues and interval medical events  Past Medical History  Diagnosis Date  . Palpitations     sinus tach and PACs on holter and normal echo 02/2010 , no ischemia ETT 05/2011  . GERD (gastroesophageal reflux disease)   . Anxiety   . Elevated blood pressure     hx  . Depression   . HYPERLIPIDEMIA, MILD     not on medication  . MORTON'S NEUROMA, LEFT   . TESTOSTERONE DEFICIENCY   . Vitamin B12 deficiency 12/17/2012    Lab Results Component Value Date  VITAMINB12 250 12/17/2012     Family History  Problem Relation Age of Onset  . Hypertension Father   . Hypertension Brother    History  Substance Use Topics  . Smoking status: Never Smoker   . Smokeless tobacco: Never Used     Comment: Married, lives with spouse. works part time as [employer] operational officer  . Alcohol Use: No     Comment: Denies alcohol use.     Review of Systems  Constitutional: Negative for fatigue and unexpected weight change.  Respiratory: Negative for cough, shortness of breath and wheezing.   Cardiovascular: Positive for palpitations (chronic, more freq in past 3 mo). Negative for chest pain and leg swelling.  Gastrointestinal: Negative for nausea, abdominal pain and diarrhea.  Neurological: Negative for dizziness, weakness, light-headedness and headaches.  Psychiatric/Behavioral: Negative for dysphoric mood. The patient is not nervous/anxious.   All other systems reviewed and are negative.      Objective:   Physical Exam  BP 100/80 mmHg  Pulse 83  Temp(Src) 97.8 F (36.6 C) (Oral)  Ht 5' 7.5" (1.715 m)  Wt 204 lb (92.534 kg)  BMI 31.46 kg/m2  SpO2 99%  LMP  (Within Days) Wt Readings from Last 3 Encounters:  01/07/14 204 lb (92.534 kg)  12/01/13 204 lb 3.2 oz (92.625 kg)  06/16/13 200 lb  (90.719 kg)   Constitutional: She is overweight, appears well-developed and well-nourished. No distress.  HENT: Head: Normocephalic and atraumatic. Ears: B TMs ok, no erythema or effusion; Nose: Nose normal. Mouth/Throat: Oropharynx is clear and moist. No oropharyngeal exudate.  Eyes: Conjunctivae and EOM are normal. Pupils are equal, round, and reactive to light. No scleral icterus.  Neck: Normal range of motion. Neck supple. No JVD present. No thyromegaly present.  Cardiovascular: Normal rate, regular rhythm and normal heart sounds.  No murmur heard. No BLE edema. Pulmonary/Chest: Effort normal and breath sounds normal. No respiratory distress. She has no wheezes.  Abdominal: Soft. Bowel sounds are normal. She exhibits no distension. There is no tenderness. no masses GU/breast: defer to gyn Musculoskeletal: Normal range of motion, no joint effusions. No gross deformities Neurological: She is alert and oriented to person, place, and time. No cranial nerve deficit. Coordination, balance, strength, speech and gait are normal.  Skin: Skin is warm and dry. No rash noted. No erythema.  Psychiatric: She has a normal mood and affect. Her behavior is normal. Judgment and thought content normal.    Lab Results  Component Value Date   WBC 7.7 01/01/2014   HGB 13.2 01/01/2014   HCT 39.4 01/01/2014   PLT 238.0 01/01/2014   GLUCOSE 86 01/01/2014   CHOL 175 01/01/2014   TRIG 83.0 01/01/2014  HDL 35.50* 01/01/2014   LDLCALC 123* 01/01/2014   ALT 17 01/01/2014   AST 17 01/01/2014   NA 136 01/01/2014   K 3.9 01/01/2014   CL 104 01/01/2014   CREATININE 0.7 01/01/2014   BUN 13 01/01/2014   CO2 26 01/01/2014   TSH 3.58 01/01/2014    Mr Brain Wo Contrast  03/28/2012   *RADIOLOGY REPORT*  Clinical Data:  Transient vision disturbance  MRI HEAD WITHOUT CONTRAST MRA HEAD WITHOUT CONTRAST  Technique:  Multiplanar, multiecho pulse sequences of the brain and surrounding structures were obtained without  intravenous contrast. Angiographic images of the head were obtained using MRA technique without contrast.  Comparison:   None  MRI HEAD  Findings:  Negative for acute infarct.  No significant chronic ischemic changes are present.  Cerebral white matter is normal. Brainstem is normal.  Negative for demyelinating disease.  Ventricle size is normal.  Negative for mass lesion.  No intracranial hemorrhage is present.  No brain edema.  Negative for fluid collection.  Pituitary gland is normal in size.  Orbit is grossly normal.  IMPRESSION: Negative  MRA HEAD  Findings: Both vertebral arteries are normal.  PICA is patent bilaterally.  Basilar is widely patent.  Superior cerebellar and posterior cerebral arteries are normal.  Internal carotid artery is patent bilaterally with mild irregularity in the cavernous segment felt to be artifact.  The anterior and middle cerebral arteries are normal.  Negative for cerebral aneurysm.  IMPRESSION: Normal   Original Report Authenticated By: Janeece Riggersavid Clark, M.D.   Mr Mra Head/brain Wo Cm  03/28/2012   *RADIOLOGY REPORT*  Clinical Data:  Transient vision disturbance  MRI HEAD WITHOUT CONTRAST MRA HEAD WITHOUT CONTRAST  Technique:  Multiplanar, multiecho pulse sequences of the brain and surrounding structures were obtained without intravenous contrast. Angiographic images of the head were obtained using MRA technique without contrast.  Comparison:   None  MRI HEAD  Findings:  Negative for acute infarct.  No significant chronic ischemic changes are present.  Cerebral white matter is normal. Brainstem is normal.  Negative for demyelinating disease.  Ventricle size is normal.  Negative for mass lesion.  No intracranial hemorrhage is present.  No brain edema.  Negative for fluid collection.  Pituitary gland is normal in size.  Orbit is grossly normal.  IMPRESSION: Negative  MRA HEAD  Findings: Both vertebral arteries are normal.  PICA is patent bilaterally.  Basilar is widely patent.   Superior cerebellar and posterior cerebral arteries are normal.  Internal carotid artery is patent bilaterally with mild irregularity in the cavernous segment felt to be artifact.  The anterior and middle cerebral arteries are normal.  Negative for cerebral aneurysm.  IMPRESSION: Normal   Original Report Authenticated By: Janeece Riggersavid Clark, M.D.       Assessment & Plan:   CPX/z00.00 - Patient has been counseled on age-appropriate routine health concerns for screening and prevention. These are reviewed and up-to-date. Immunizations are up-to-date or declined. Labs reviewed.  Problem List Items Addressed This Visit    Heart palpitations    Chronic symptoms, follows closely with cardiology for same  Prior Holter in 2012 event monitor in 2014 reviewed  -no significant abnormalities  Cardiology felt palpitations related to anxiety, previously improved control with current dose of beta blocker-but now increasing burden of palpitation symptoms despite treatment for anxiety with SSRI. Question SVT Follow-up with cardiology for same, question potential for EP evaluation  No med changes recommended by me today  Other Visit Diagnoses    Routine general medical examination at a health care facility    -  Primary

## 2014-01-07 NOTE — Assessment & Plan Note (Signed)
Chronic symptoms, follows closely with cardiology for same  Prior Holter in 2012 event monitor in 2014 reviewed  -no significant abnormalities  Cardiology felt palpitations related to anxiety, previously improved control with current dose of beta blocker-but now increasing burden of palpitation symptoms despite treatment for anxiety with SSRI. Question SVT Follow-up with cardiology for same, question potential for EP evaluation  No med changes recommended by me today

## 2014-01-07 NOTE — Patient Instructions (Addendum)
It was good to see you today.  We have reviewed your prior records including labs and tests today  Health Maintenance reviewed - all recommended immunizations and age-appropriate screenings are up-to-date.  Test(s) ordered today. Your results will be released to [place] (or called to you) after review, usually within 72hours after test completion. If any changes need to be made, you will be notified at that same time.  Medications reviewed and updated, no changes recommended at this time.  Please schedule followup in 12 months for annual exam and labs, call sooner if problems.  Health Maintenance Adopting a healthy lifestyle and getting preventive care can go a long way to promote health and wellness. Talk with your health care provider about what schedule of regular examinations is right for you. This is a good chance for you to check in with your provider about disease prevention and staying healthy. In between checkups, there are plenty of things you can do on your own. Experts have done a lot of research about which lifestyle changes and preventive measures are most likely to keep you healthy. Ask your health care provider for more information. WEIGHT AND DIET  Eat a healthy diet  Be sure to include plenty of vegetables, fruits, low-fat dairy products, and lean protein.  Do not eat a lot of foods high in solid fats, added sugars, or salt.  Get regular exercise. This is one of the most important things you can do for your health.  Most adults should exercise for at least 150 minutes each week. The exercise should increase your heart rate and make you sweat (moderate-intensity exercise).  Most adults should also do strengthening exercises at least twice a week. This is in addition to the moderate-intensity exercise.  Maintain a healthy weight  Body mass index (BMI) is a measurement that can be used to identify possible weight problems. It estimates body fat based on height and weight.  Your health care provider can help determine your BMI and help you achieve or maintain a healthy weight.  For females 20 years of age and older:   A BMI below 18.5 is considered underweight.  A BMI of 18.5 to 24.9 is normal.  A BMI of 25 to 29.9 is considered overweight.  A BMI of 30 and above is considered obese.  Watch levels of cholesterol and blood lipids  You should start having your blood tested for lipids and cholesterol at 50 years of age, then have this test every 5 years.  You may need to have your cholesterol levels checked more often if:  Your lipid or cholesterol levels are high.  You are older than 50 years of age.  You are at high risk for heart disease.  CANCER SCREENING   Lung Cancer  Lung cancer screening is recommended for adults 1-59 years old who are at high risk for lung cancer because of a history of smoking.  A yearly low-dose CT scan of the lungs is recommended for people who:  Currently smoke.  Have quit within the past 15 years.  Have at least a 30-pack-year history of smoking. A pack year is smoking an average of one pack of cigarettes a day for 1 year.  Yearly screening should continue until it has been 15 years since you quit.  Yearly screening should stop if you develop a health problem that would prevent you from having lung cancer treatment.  Breast Cancer  Practice breast self-awareness. This means understanding how your breasts normally appear and  feel.  It also means doing regular breast self-exams. Let your health care provider know about any changes, no matter how small.  If you are in your 20s or 30s, you should have a clinical breast exam (CBE) by a health care provider every 1-3 years as part of a regular health exam.  If you are 40 or older, have a CBE every year. Also consider having a breast X-ray (mammogram) every year.  If you have a family history of breast cancer, talk to your health care provider about genetic  screening.  If you are at high risk for breast cancer, talk to your health care provider about having an MRI and a mammogram every year.  Breast cancer gene (BRCA) assessment is recommended for women who have family members with BRCA-related cancers. BRCA-related cancers include:  Breast.  Ovarian.  Tubal.  Peritoneal cancers.  Results of the assessment will determine the need for genetic counseling and BRCA1 and BRCA2 testing. Cervical Cancer Routine pelvic examinations to screen for cervical cancer are no longer recommended for nonpregnant women who are considered low risk for cancer of the pelvic organs (ovaries, uterus, and vagina) and who do not have symptoms. A pelvic examination may be necessary if you have symptoms including those associated with pelvic infections. Ask your health care provider if a screening pelvic exam is right for you.   The Pap test is the screening test for cervical cancer for women who are considered at risk.  If you had a hysterectomy for a problem that was not cancer or a condition that could lead to cancer, then you no longer need Pap tests.  If you are older than 65 years, and you have had normal Pap tests for the past 10 years, you no longer need to have Pap tests.  If you have had past treatment for cervical cancer or a condition that could lead to cancer, you need Pap tests and screening for cancer for at least 20 years after your treatment.  If you no longer get a Pap test, assess your risk factors if they change (such as having a new sexual partner). This can affect whether you should start being screened again.  Some women have medical problems that increase their chance of getting cervical cancer. If this is the case for you, your health care provider may recommend more frequent screening and Pap tests.  The human papillomavirus (HPV) test is another test that may be used for cervical cancer screening. The HPV test looks for the virus that can  cause cell changes in the cervix. The cells collected during the Pap test can be tested for HPV.  The HPV test can be used to screen women 30 years of age and older. Getting tested for HPV can extend the interval between normal Pap tests from three to five years.  An HPV test also should be used to screen women of any age who have unclear Pap test results.  After 50 years of age, women should have HPV testing as often as Pap tests.  Colorectal Cancer  This type of cancer can be detected and often prevented.  Routine colorectal cancer screening usually begins at 50 years of age and continues through 50 years of age.  Your health care provider may recommend screening at an earlier age if you have risk factors for colon cancer.  Your health care provider may also recommend using home test kits to check for hidden blood in the stool.  A small camera   at the end of a tube can be used to examine your colon directly (sigmoidoscopy or colonoscopy). This is done to check for the earliest forms of colorectal cancer.  Routine screening usually begins at age 50.  Direct examination of the colon should be repeated every 5-10 years through 50 years of age. However, you may need to be screened more often if early forms of precancerous polyps or small growths are found. Skin Cancer  Check your skin from head to toe regularly.  Tell your health care provider about any new moles or changes in moles, especially if there is a change in a mole's shape or color.  Also tell your health care provider if you have a mole that is larger than the size of a pencil eraser.  Always use sunscreen. Apply sunscreen liberally and repeatedly throughout the day.  Protect yourself by wearing long sleeves, pants, a wide-brimmed hat, and sunglasses whenever you are outside. HEART DISEASE, DIABETES, AND HIGH BLOOD PRESSURE   Have your blood pressure checked at least every 1-2 years. High blood pressure causes heart  disease and increases the risk of stroke.  If you are between 55 years and 79 years old, ask your health care provider if you should take aspirin to prevent strokes.  Have regular diabetes screenings. This involves taking a blood sample to check your fasting blood sugar level.  If you are at a normal weight and have a low risk for diabetes, have this test once every three years after 50 years of age.  If you are overweight and have a high risk for diabetes, consider being tested at a younger age or more often. PREVENTING INFECTION  Hepatitis B  If you have a higher risk for hepatitis B, you should be screened for this virus. You are considered at high risk for hepatitis B if:  You were born in a country where hepatitis B is common. Ask your health care provider which countries are considered high risk.  Your parents were born in a high-risk country, and you have not been immunized against hepatitis B (hepatitis B vaccine).  You have HIV or AIDS.  You use needles to inject street drugs.  You live with someone who has hepatitis B.  You have had sex with someone who has hepatitis B.  You get hemodialysis treatment.  You take certain medicines for conditions, including cancer, organ transplantation, and autoimmune conditions. Hepatitis C  Blood testing is recommended for:  Everyone born from 1945 through 1965.  Anyone with known risk factors for hepatitis C. Sexually transmitted infections (STIs)  You should be screened for sexually transmitted infections (STIs) including gonorrhea and chlamydia if:  You are sexually active and are younger than 50 years of age.  You are older than 50 years of age and your health care provider tells you that you are at risk for this type of infection.  Your sexual activity has changed since you were last screened and you are at an increased risk for chlamydia or gonorrhea. Ask your health care provider if you are at risk.  If you do not have  HIV, but are at risk, it may be recommended that you take a prescription medicine daily to prevent HIV infection. This is called pre-exposure prophylaxis (PrEP). You are considered at risk if:  You are sexually active and do not regularly use condoms or know the HIV status of your partner(s).  You take drugs by injection.  You are sexually active with a partner   who has HIV. Talk with your health care provider about whether you are at high risk of being infected with HIV. If you choose to begin PrEP, you should first be tested for HIV. You should then be tested every 3 months for as long as you are taking PrEP.  PREGNANCY   If you are premenopausal and you may become pregnant, ask your health care provider about preconception counseling.  If you may become pregnant, take 400 to 800 micrograms (mcg) of folic acid every day.  If you want to prevent pregnancy, talk to your health care provider about birth control (contraception). OSTEOPOROSIS AND MENOPAUSE   Osteoporosis is a disease in which the bones lose minerals and strength with aging. This can result in serious bone fractures. Your risk for osteoporosis can be identified using a bone density scan.  If you are 65 years of age or older, or if you are at risk for osteoporosis and fractures, ask your health care provider if you should be screened.  Ask your health care provider whether you should take a calcium or vitamin D supplement to lower your risk for osteoporosis.  Menopause may have certain physical symptoms and risks.  Hormone replacement therapy may reduce some of these symptoms and risks. Talk to your health care provider about whether hormone replacement therapy is right for you.  HOME CARE INSTRUCTIONS   Schedule regular health, dental, and eye exams.  Stay current with your immunizations.   Do not use any tobacco products including cigarettes, chewing tobacco, or electronic cigarettes.  If you are pregnant, do not  drink alcohol.  If you are breastfeeding, limit how much and how often you drink alcohol.  Limit alcohol intake to no more than 1 drink per day for nonpregnant women. One drink equals 12 ounces of beer, 5 ounces of wine, or 1 ounces of hard liquor.  Do not use street drugs.  Do not share needles.  Ask your health care provider for help if you need support or information about quitting drugs.  Tell your health care provider if you often feel depressed.  Tell your health care provider if you have ever been abused or do not feel safe at home. Document Released: 07/04/2010 Document Revised: 05/05/2013 Document Reviewed: 11/20/2012 [employer] Patient Information 2015 [employer]. This information is not intended to replace advice given to you by your health care provider. Make sure you discuss any questions you have with your health care provider.  

## 2014-01-08 ENCOUNTER — Encounter: Payer: Self-pay | Admitting: Cardiovascular Disease

## 2014-01-08 ENCOUNTER — Ambulatory Visit (INDEPENDENT_AMBULATORY_CARE_PROVIDER_SITE_OTHER): Payer: BLUE CROSS/BLUE SHIELD | Admitting: Cardiovascular Disease

## 2014-01-08 VITALS — BP 116/76 | HR 92 | Ht 68.0 in | Wt 204.8 lb

## 2014-01-08 DIAGNOSIS — R002 Palpitations: Secondary | ICD-10-CM

## 2014-01-08 DIAGNOSIS — R079 Chest pain, unspecified: Secondary | ICD-10-CM

## 2014-01-08 NOTE — Progress Notes (Signed)
HPI  This is a 49 year old pleasant female who is here today for a followup visit regarding palpitations due to documented PACs. She had an echocardiogram done in 2012 and 2014 which showed normal LV systolic function without significant structural or valvular abnormalities. She had a Holter monitor done which showed few PACs without any other significant arrhythmia. She had sinus tachycardia with slightly elevated mean average heart rate. The patient's symptoms seems to correlate mostly with anxiety. She underwent a treadmill stress test in 2013 which showed no evidence of ischemia. She was started on Toprol 25 mg once daily. Her symptoms improved but she continued to have tachycardia. Thus, I proceeded with a 14 day outpatient telemetry which basically showed frequent PACs with no other significant arrhythmia. Her symptoms of palpitations correlated with PACs.  She was started on Paxil by PCP. Anxiety symptoms have improved. She reports worsening palpitations recently. This is still described as skipping in her heart with no episodes of tachycardia.   Allergies  Allergen Reactions  . Latex     rash     Current Outpatient Prescriptions on File Prior to Visit  Medication Sig Dispense Refill  . Cholecalciferol (VITAMIN D) 1000 UNITS capsule Take 1,000 Units by mouth daily.    . Cyanocobalamin 2500 MCG TABS Take 2,500 mcg by mouth daily.    . metoprolol succinate (TOPROL-XL) 25 MG 24 hr tablet Take 1 tablet (25 mg total) by mouth daily. 90 tablet 3  . naproxen sodium (ANAPROX) 220 MG tablet Take 220 mg by mouth daily.    . NONFORMULARY OR COMPOUNDED ITEM Topical testosterone propionate 2% cream.  Apply 1/4 to inner thighs two to three times weekly.  Disp 60 gms 1 each 1  . norethindrone (MICRONOR,CAMILA,ERRIN) 0.35 MG tablet Take 1 tablet (0.35 mg total) by mouth daily. 1 Package 13  . PARoxetine (PAXIL) 10 MG tablet Take 1 tablet (10 mg total) by mouth daily. 90 tablet 3   No current  facility-administered medications on file prior to visit.     Past Medical History  Diagnosis Date  . Palpitations     sinus tach and PACs on holter and normal echo 02/2010 , no ischemia ETT 05/2011  . GERD (gastroesophageal reflux disease)   . Anxiety   . Elevated blood pressure     hx  . Depression   . HYPERLIPIDEMIA, MILD     not on medication  . MORTON'S NEUROMA, LEFT   . TESTOSTERONE DEFICIENCY   . Vitamin B12 deficiency 12/17/2012    Lab Results Component Value Date  VITAMINB12 250 12/17/2012       Past Surgical History  Procedure Laterality Date  . Cesarean section  5/91, 6/93, 7/02  . Tubal ligation       Family History  Problem Relation Age of Onset  . Hypertension Father   . Hypertension Brother      History   Social History  . Marital Status: Married    Spouse Name: N/A    Number of Children: 3  . Years of Education: N/A   Occupational History  . Environmental health practitioner    Social History Main Topics  . Smoking status: Never Smoker   . Smokeless tobacco: Never Used     Comment: Married, lives with spouse. works part time as Diplomatic Services operational officer  . Alcohol Use: No     Comment: Denies alcohol use.   . Drug Use: No  . Sexual Activity:    Partners: Male    Birth  Control/ Protection: Surgical     Comment: BTL   Other Topics Concern  . Not on file   Social History Narrative      PHYSICAL EXAM   BP 116/76 mmHg  Pulse 92  Ht 5\' 8"  (1.727 m)  Wt 204 lb 12 oz (92.874 kg)  BMI 31.14 kg/m2  LMP  (Within Days)  Constitutional: She is oriented to person, place, and time. She appears well-developed and well-nourished. No distress.  HENT: No nasal discharge.  Head: Normocephalic and atraumatic.  Eyes: Pupils are equal and round. Right eye exhibits no discharge. Left eye exhibits no discharge.  Neck: Normal range of motion. Neck supple. No JVD present. No thyromegaly present.  Cardiovascular: Normal rate, regular rhythm, normal heart sounds. Exam reveals  no gallop and no friction rub. No murmur heard.  Pulmonary/Chest: Effort normal and breath sounds normal. No stridor. No respiratory distress. She has no wheezes. She has no rales. She exhibits no tenderness.  Abdominal: Soft. Bowel sounds are normal. She exhibits no distension. There is no tenderness. There is no rebound and no guarding.  Musculoskeletal: Normal range of motion. She exhibits no edema and no tenderness.  Neurological: She is alert and oriented to person, place, and time. Coordination normal.  Skin: Skin is warm and dry. No rash noted. She is not diaphoretic. No erythema. No pallor.  Psychiatric: She has a normal mood and affect. Her behavior is normal. Judgment and thought content normal.    EKG: Sinus  Rhythm  Low voltage in precordial leads.   ABNORMAL    ASSESSMENT AND PLAN

## 2014-01-08 NOTE — Assessment & Plan Note (Signed)
Her symptoms are still suggestive of premature beats. She had extensive workup in the past including echocardiogram, stress test and outpatient telemetry. The history is not suggestive of tachycardia. The dose of Toprol cannot be increased due to relatively low blood pressure. I suggested that she obtains a Smart phone monitor which she can keep with her all the time and record whenever she is symptomatic. She can bring these to us for further evaluation.

## 2014-01-08 NOTE — Patient Instructions (Signed)
I recommend that you get the Alivecor monitor. You can get that from [facility] for [facility] $.   Continue same medications.   Follow up in 3 months.

## 2014-01-15 ENCOUNTER — Telehealth: Payer: Self-pay

## 2014-01-15 NOTE — Telephone Encounter (Signed)
Pt ordered the Live core heart monitor and has a question regarding it. Please call. Cell [phone]

## 2014-01-15 NOTE — Telephone Encounter (Signed)
Patient had a question about a reading on her monitor  I instructed her to send it to us and I would give it to Dr. [name] for review   Print out placed on Dr. Sheilah PigeonAridas box

## 2014-01-16 NOTE — Telephone Encounter (Signed)
Informed patient that her strip is normal sinus rhythm per Dr. [name]  Patient verbalized understanding

## 2014-02-02 ENCOUNTER — Telehealth: Payer: Self-pay | Admitting: *Deleted

## 2014-02-02 NOTE — Telephone Encounter (Signed)
Called patient re email she had sent asking if she can send Dr. [name] her Alivcor  Heart monitor readings  Informed patient that she can send me the ekg readings

## 2014-03-04 ENCOUNTER — Telehealth: Payer: Self-pay | Admitting: Obstetrics & Gynecology

## 2014-03-04 NOTE — Telephone Encounter (Signed)
Called and left messages for patient to call back to reschedule her 3 month recheck that was 03/05/14 with Dr. Hyacinth MeekerMiller.  Cc: Tresa EndoKelly

## 2014-03-05 ENCOUNTER — Ambulatory Visit: Payer: BC Managed Care – PPO | Admitting: Obstetrics & Gynecology

## 2014-03-06 NOTE — Telephone Encounter (Signed)
3/4 lmtcb//kn

## 2014-03-17 NOTE — Telephone Encounter (Signed)
No letter needed.  Two phone calls is enough documentation.  She was going to try micronor so probably decided not to do so.  Thanks.

## 2014-03-17 NOTE — Telephone Encounter (Signed)
Dr Hyacinth MeekerMiller, I have left her a message, as well as the front desk, to see if she wants to reschedule. I have not heard anything from her. Do I need to send a letter? Please advise.//kn

## 2014-03-23 NOTE — Telephone Encounter (Signed)
Yes.  OK to close encounter. 

## 2014-03-23 NOTE — Telephone Encounter (Signed)
Okay to close encounter?  °

## 2014-03-31 ENCOUNTER — Ambulatory Visit (INDEPENDENT_AMBULATORY_CARE_PROVIDER_SITE_OTHER): Payer: BLUE CROSS/BLUE SHIELD | Admitting: Obstetrics & Gynecology

## 2014-03-31 ENCOUNTER — Encounter: Payer: Self-pay | Admitting: Obstetrics & Gynecology

## 2014-03-31 VITALS — BP 108/66 | HR 80 | Resp 16 | Ht 68.0 in | Wt 204.0 lb

## 2014-03-31 DIAGNOSIS — R002 Palpitations: Secondary | ICD-10-CM

## 2014-03-31 DIAGNOSIS — R6882 Decreased libido: Secondary | ICD-10-CM | POA: Diagnosis not present

## 2014-03-31 MED ORDER — NORETHINDRONE 0.35 MG PO TABS: 1.0000 | ORAL_TABLET | Freq: Every day | ORAL | Status: DC

## 2014-03-31 NOTE — Progress Notes (Signed)
Subjective:     Patient ID: Alyssa Pratt, female   DOB: 06/06/1965, [age] y.o.   MRN: [id number]  HPI 49 yo G3P3 here for follow up after starting Micronor.  Cycles haven't changed at all with micronor.  Reports cycles are regular and still predictable.  However, she does feel that the palpitations have gotten better with the micronor.  Dr. Real ConsAirda is her cardiology and had her purchase a device that she puts between her index fingers and thumbs.  This interfaced with an application on her smart phone and can record a rhythm strip when she is having what feels like palpitations to her.  She has done this on several occasions, however, her cardiologist hasn't seen anything abnormal in her rhythm when she is feeling these.  She is reassured by this.  Reviewed note from 01/08/14 with Dr. [name].  Echo normal 2014.  Stress treadmill normal 2013.  Only finding has been a few PACs.  He feels this is more anxiety related than anything.  Declines treatment for this.  She does report she feels better knowing, at least, that evaluation has been normal and Dr. [name] doesn't think she needs  to do anything else.  On a separate topic, reports she feels the testosterone cream is helping.  However, when she uses the Testosterone, she has itching all over.  No rash on her skin.  No other symptoms.  Take a Benadryl and this helps.  She is still using this.  Needs level drawn today.  Having it compounded at [facility].  Wants to keep using but would like to not have itching.   Review of Systems  All other systems reviewed and are negative.      Objective:   Physical Exam  Constitutional: She is oriented to person, place, and time. She appears well-developed and well-nourished.  Neurological: She is alert and oriented to person, place, and time.  Psychiatric: She has a normal mood and affect.       Assessment:     Palpitations with cardiac evaluation that has only shown a few PACs Improvement with micronor use Decreased  libido     Plan:     Pt desires to continue the micronor.  Rx for the rest of the year to pharmacy Testoerone level today Will contact [facility] and see if there is a change we can make with the testosterone    ~15 minutes spent with patient >50% of time was in face to face discussion of above.

## 2014-04-01 LAB — TESTOSTERONE: Testosterone: 24 ng/dL (ref 10–70)

## 2014-04-07 ENCOUNTER — Encounter: Payer: Self-pay | Admitting: Obstetrics & Gynecology

## 2014-04-07 NOTE — Telephone Encounter (Signed)
[facility]  D/w Tamela OddiBetsy pt's itching issues after application of topical testosterone propionate.  Will switch to 2% testosterone in HRT base.  Will not use versabase right now.

## 2014-04-09 ENCOUNTER — Encounter: Payer: Self-pay | Admitting: Cardiovascular Disease

## 2014-04-09 ENCOUNTER — Ambulatory Visit (INDEPENDENT_AMBULATORY_CARE_PROVIDER_SITE_OTHER): Payer: BLUE CROSS/BLUE SHIELD | Admitting: Cardiovascular Disease

## 2014-04-09 VITALS — BP 102/62 | HR 78 | Ht 68.0 in | Wt 207.5 lb

## 2014-04-09 DIAGNOSIS — R002 Palpitations: Secondary | ICD-10-CM | POA: Diagnosis not present

## 2014-04-09 NOTE — Assessment & Plan Note (Signed)
Due to sinus tachycardia and PACs in the setting of anxiety. Symptoms improved significantly with small dose Toprol and Paxil. Continue same medications and follow-up in one year or earlier if needed.

## 2014-04-09 NOTE — Progress Notes (Signed)
HPI  This is a 49 year old pleasant female who is here today for a followup visit regarding palpitations due to documented PACs. She had an echocardiogram done in 2012 and 2014 which showed normal LV systolic function without significant structural or valvular abnormalities. She had a Holter monitor done which showed few PACs without any other significant arrhythmia. She had sinus tachycardia with slightly elevated mean average heart rate. The patient's symptoms seems to correlate mostly with anxiety. She underwent a treadmill stress test in 2013 which showed no evidence of ischemia. She was started on Toprol 25 mg once daily. Her symptoms improved but she continued to have tachycardia. Thus, I proceeded with a 14 day outpatient telemetry which basically showed frequent PACs with no other significant arrhythmia. Her symptoms of palpitations correlated with PACs.  She was started on Paxil by PCP. Anxiety symptoms have improved.  She has been doing very well overall with no recurrent symptoms. She has a Smart phone heart rate monitor. The tracings she sent to me were all consistent with sinus tachycardia.   Allergies  Allergen Reactions  . Latex     rash     Current Outpatient Prescriptions on File Prior to Visit  Medication Sig Dispense Refill  . Cholecalciferol (VITAMIN D) 1000 UNITS capsule Take 1,000 Units by mouth daily.    . Cyanocobalamin 2500 MCG TABS Take 2,500 mcg by mouth daily.    . metoprolol succinate (TOPROL-XL) 25 MG 24 hr tablet Take 1 tablet (25 mg total) by mouth daily. 90 tablet 3  . naproxen sodium (ANAPROX) 220 MG tablet Take 220 mg by mouth daily.    . NONFORMULARY OR COMPOUNDED ITEM Topical testosterone propionate 2% cream.  Apply 1/4 to inner thighs two to three times weekly.  Disp 60 gms 1 each 1  . norethindrone (MICRONOR,CAMILA,ERRIN) 0.35 MG tablet Take 1 tablet (0.35 mg total) by mouth daily. 3 Package 4  . PARoxetine (PAXIL) 10 MG tablet Take 1 tablet (10 mg  total) by mouth daily. 90 tablet 3   No current facility-administered medications on file prior to visit.     Past Medical History  Diagnosis Date  . Palpitations     sinus tach and PACs on holter and normal echo 02/2010 , no ischemia ETT 05/2011  . GERD (gastroesophageal reflux disease)   . Anxiety   . Elevated blood pressure     hx  . Depression   . HYPERLIPIDEMIA, MILD     not on medication  . MORTON'S NEUROMA, LEFT   . TESTOSTERONE DEFICIENCY   . Vitamin B12 deficiency 12/17/2012    Lab Results Component Value Date  VITAMINB12 250 12/17/2012       Past Surgical History  Procedure Laterality Date  . Cesarean section  5/91, 6/93, 7/02  . Tubal ligation       Family History  Problem Relation Age of Onset  . Hypertension Father   . Hypertension Brother      History   Social History  . Marital Status: Married    Spouse Name: N/A  . Number of Children: 3  . Years of Education: N/A   Occupational History  . Environmental health practitioner    Social History Main Topics  . Smoking status: Never Smoker   . Smokeless tobacco: Never Used     Comment: Married, lives with spouse. works part time as Diplomatic Services operational officer  . Alcohol Use: No     Comment: Denies alcohol use.   . Drug Use: No  .  Sexual Activity:    Partners: Male    Pharmacist, hospitalBirth Control/ Protection: Surgical     Comment: BTL   Other Topics Concern  . Not on file   Social History Narrative      PHYSICAL EXAM   BP 102/62 mmHg  Pulse 78  Ht 5\' 8"  (1.727 m)  Wt 207 lb 8 oz (94.121 kg)  BMI 31.56 kg/m2  LMP 03/24/2014  Constitutional: She is oriented to person, place, and time. She appears well-developed and well-nourished. No distress.  HENT: No nasal discharge.  Head: Normocephalic and atraumatic.  Eyes: Pupils are equal and round. Right eye exhibits no discharge. Left eye exhibits no discharge.  Neck: Normal range of motion. Neck supple. No JVD present. No thyromegaly present.  Cardiovascular: Normal rate,  regular rhythm, normal heart sounds. Exam reveals no gallop and no friction rub. No murmur heard.  Pulmonary/Chest: Effort normal and breath sounds normal. No stridor. No respiratory distress. She has no wheezes. She has no rales. She exhibits no tenderness.  Abdominal: Soft. Bowel sounds are normal. She exhibits no distension. There is no tenderness. There is no rebound and no guarding.  Musculoskeletal: Normal range of motion. She exhibits no edema and no tenderness.  Neurological: She is alert and oriented to person, place, and time. Coordination normal.  Skin: Skin is warm and dry. No rash noted. She is not diaphoretic. No erythema. No pallor.  Psychiatric: She has a normal mood and affect. Her behavior is normal. Judgment and thought content normal.     ASSESSMENT AND PLAN

## 2014-04-09 NOTE — Patient Instructions (Signed)
Continue same medications.   Your physician wants you to follow-up in: 1 year.  You will receive a reminder letter in the mail two months in advance. If you don't receive a letter, please call our office to schedule the follow-up appointment.  

## 2014-04-28 ENCOUNTER — Encounter: Payer: Self-pay | Admitting: Cardiovascular Disease

## 2014-04-28 NOTE — Telephone Encounter (Signed)
Pt sent me an email with her Alive Core Readings and this information: I've not felt well today lots of flutters and had a spell last night of squeezing pain that lasts just seconds. Please call.   I also scanned AliveCore readings

## 2014-05-05 NOTE — Telephone Encounter (Signed)
This encounter was created in error - please disregard.

## 2014-05-05 NOTE — Telephone Encounter (Signed)
Pt sent email on 04/30/2014 with this info  Here's another unclassified reading I got today.  I had another episode last night of heart beating quickly and some chest pains and the another episode of racing heart beat in middle of night.  I just took this reading because heart felt like it was racing.  Just want to make sure Dr. [name] still thinks all is ok.  Thank you for getting this alivecor reading to him.

## 2014-06-08 ENCOUNTER — Encounter: Payer: Self-pay | Admitting: Cardiovascular Disease

## 2014-07-02 ENCOUNTER — Encounter: Payer: Self-pay | Admitting: Cardiovascular Disease

## 2014-09-02 ENCOUNTER — Encounter: Payer: Self-pay | Admitting: Cardiovascular Disease

## 2014-09-08 ENCOUNTER — Encounter: Payer: Self-pay | Admitting: Cardiovascular Disease

## 2014-09-23 ENCOUNTER — Other Ambulatory Visit: Payer: Self-pay | Admitting: *Deleted

## 2014-09-23 NOTE — Telephone Encounter (Signed)
Medication refill request: Testosterone  Last AEX:  12/01/13 SM Next AEX: None Last MMG (if hormonal medication request): 12/10/13 [id number]:neg Refill authorized: 12/01/13 #1each/1R. Today please advise.

## 2014-09-24 NOTE — Telephone Encounter (Signed)
Please call pt and check with her to see how she is doing with the testosterone.  May need repeat blood level.

## 2014-09-25 NOTE — Telephone Encounter (Signed)
Lmtcb//kn 

## 2014-09-28 ENCOUNTER — Other Ambulatory Visit (INDEPENDENT_AMBULATORY_CARE_PROVIDER_SITE_OTHER): Payer: BLUE CROSS/BLUE SHIELD

## 2014-09-28 DIAGNOSIS — R6882 Decreased libido: Secondary | ICD-10-CM

## 2014-09-28 DIAGNOSIS — E349 Endocrine disorder, unspecified: Secondary | ICD-10-CM

## 2014-09-28 NOTE — Telephone Encounter (Signed)
Spoke with patient-states is doing well with the topical testosterone. Is now using Monday-Friday. Last level checked in 3/16. Aware will need this repeated. Appointment made for today. Aware will check with provider to see about refilling the medication, as she is completely out. Please advise.//kn

## 2014-09-29 LAB — TESTOSTERONE: Testosterone: 105 ng/dL — ABNORMAL HIGH (ref 10–70)

## 2014-09-30 ENCOUNTER — Telehealth: Payer: Self-pay

## 2014-09-30 MED ORDER — NONFORMULARY OR COMPOUNDED ITEM: Status: DC

## 2014-09-30 NOTE — Telephone Encounter (Signed)
Rx faxed to [facility] today

## 2014-09-30 NOTE — Telephone Encounter (Signed)
-----   Message from Patton Salles, MD sent at 09/29/2014  3:16 PM EDT ----- Please contact patient with results.  The testosterone is a little high now.  We would like to see it in a normal female range. She is using the testosterone twice weekly, and I recommend that she reduce the dosage to half of what she is currently doing.  This means that she could cut her total dosage overall in half and continue to apply it twice per week. She will need a new prescription, and this will need to be printed as testosterone will not go through electronically.  I can sign it tomorrow for her.   Cc- Lorrene Reid

## 2014-09-30 NOTE — Telephone Encounter (Signed)
Spoke with patient. Advised of results as seen below from Dr.Silva. Patient is agreeable and verbalizes understanding. Rx for Testosterone Propionate 2% cream apply 1/8 to inner thighs two times per week. Dispense 60 grams #1 1RF printed for Dr.Silva's review and signature before faxing to [facility] pharmacy on file.

## 2014-09-30 NOTE — Telephone Encounter (Signed)
Rx faxed by Myra Rude, CMA today with cover sheet and confirmation to [facility] pharmacy.  Routing to provider for final review. Patient agreeable to disposition. Will close encounter.

## 2014-10-08 ENCOUNTER — Telehealth: Payer: Self-pay | Admitting: *Deleted

## 2014-10-08 NOTE — Telephone Encounter (Signed)
Spoke w/ pt.  She reports that she had 4 episodes of "really quick chest pain" in the center of her chest.  Reports 1st episode was when she was walking to her car, twice when she was sitting at her desk, and once while she was driving.  Reports that pain was "instantaneous", stopped as fast as it started.  Reports that sx were previously r/t "an extra beat" but she did not feel that this time.  Pt denies SOB, does report sweating, but believes this was due to her nerves, as the pain was so bad that it scared her.  She requests an appt to see Dr. Kirke Corin, but he will be out of the office next week.  She would like to know if he has any recommendations, as she does not want to go to ED if sx return.

## 2014-10-08 NOTE — Telephone Encounter (Signed)
Pt c/o of Chest Pain: STAT if CP now or developed within 24 hours  1. Are you having CP right now? Not now, just earlier today. Up to 4 small quick episodes   2. Are you experiencing any other symptoms (ex. SOB, nausea, vomiting, sweating)? Not right now, but durring them she got sweaty. One time she was talking to her car and the other times was where she was sitting at her desk  3. How long have you been experiencing CP? jus today started  4. Is your CP continuous or coming and going? Coming and going   5. Have you taken Nitroglycerin? No ?

## 2014-10-08 NOTE — Telephone Encounter (Signed)
That sounds like muscular spasm.

## 2014-10-09 NOTE — Telephone Encounter (Signed)
S/w pt to relay information from Dr. Kirke Corin that this sounds like muscular spasm. Reviewed s/s that would need immediate attention in the ER. Pt verbalized understanding and states she will call her PCP about possible spasms.

## 2015-01-01 ENCOUNTER — Ambulatory Visit (INDEPENDENT_AMBULATORY_CARE_PROVIDER_SITE_OTHER): Payer: BLUE CROSS/BLUE SHIELD | Admitting: Obstetrics & Gynecology

## 2015-01-01 ENCOUNTER — Encounter: Payer: Self-pay | Admitting: Obstetrics & Gynecology

## 2015-01-01 VITALS — BP 110/72 | HR 84 | Resp 18 | Ht 68.0 in | Wt 212.0 lb

## 2015-01-01 DIAGNOSIS — Z01419 Encounter for gynecological examination (general) (routine) without abnormal findings: Secondary | ICD-10-CM | POA: Diagnosis not present

## 2015-01-01 DIAGNOSIS — R0789 Other chest pain: Secondary | ICD-10-CM | POA: Diagnosis not present

## 2015-01-01 DIAGNOSIS — R6882 Decreased libido: Secondary | ICD-10-CM | POA: Diagnosis not present

## 2015-01-01 DIAGNOSIS — Z124 Encounter for screening for malignant neoplasm of cervix: Secondary | ICD-10-CM

## 2015-01-01 DIAGNOSIS — N926 Irregular menstruation, unspecified: Secondary | ICD-10-CM | POA: Diagnosis not present

## 2015-01-01 DIAGNOSIS — Z0000 Encounter for general adult medical examination without abnormal findings: Secondary | ICD-10-CM | POA: Diagnosis not present

## 2015-01-01 LAB — COMPREHENSIVE METABOLIC PANEL
ALT: 25 U/L (ref 6–29)
AST: 18 U/L (ref 10–35)
Albumin: 4.3 g/dL (ref 3.6–5.1)
Alkaline Phosphatase: 62 U/L (ref 33–130)
BUN: 14 mg/dL (ref 7–25)
CO2: 27 mmol/L (ref 20–31)
Calcium: 10.1 mg/dL (ref 8.6–10.4)
Chloride: 102 mmol/L (ref 98–110)
Creat: 0.81 mg/dL (ref 0.50–1.05)
Glucose, Bld: 79 mg/dL (ref 65–99)
Potassium: 4 mmol/L (ref 3.5–5.3)
Sodium: 136 mmol/L (ref 135–146)
Total Bilirubin: 0.6 mg/dL (ref 0.2–1.2)
Total Protein: 7.1 g/dL (ref 6.1–8.1)

## 2015-01-01 LAB — LIPID PANEL
Cholesterol: 173 mg/dL (ref 125–200)
HDL: 34 mg/dL — ABNORMAL LOW (ref >=46)
LDL Cholesterol: 116 mg/dL (ref <130)
Total CHOL/HDL Ratio: 5.1 Ratio — ABNORMAL HIGH (ref <=5.0)
Triglycerides: 114 mg/dL (ref <150)
VLDL: 23 mg/dL (ref <30)

## 2015-01-01 MED ORDER — PAROXETINE HCL 10 MG PO TABS: 10.0000 mg | ORAL_TABLET | Freq: Every day | ORAL | Status: DC

## 2015-01-01 MED ORDER — NORETHINDRONE 0.35 MG PO TABS: 1.0000 | ORAL_TABLET | Freq: Every day | ORAL | Status: DC

## 2015-01-01 NOTE — Progress Notes (Signed)
49 y.o. G3P3 Married Caucasian female here for annual exam.  Doing well.  Has some questions about her topical testosterone cream.  Does not cycle normally.  Flow is light.    Patient's last menstrual period was 12/14/2014 (exact date).          Sexually active: Yes.    The current method of family planning is none.    Exercising: No.  None Smoker:  yes  Health Maintenance: Pap: 11/15/12 WNL/negative HR HPV History of abnormal Pap: no MMG:12/10/13 - BIRADS category 1 Negative Colonoscopy: 2012-repeat in 10 years Dr Loreta AveMann BMD: none TDaP: 11/11 Denies shingles or pneumonia shots. Screening Labs: today, Hb today: today, Urine today: pending   reports that she has never smoked. She has never used smokeless tobacco. She reports that she does not drink alcohol or use illicit drugs.  Past Medical History  Diagnosis Date  . Palpitations     sinus tach and PACs on holter and normal echo 02/2010 , no ischemia ETT 05/2011  . GERD (gastroesophageal reflux disease)   . Anxiety   . Elevated blood pressure     hx  . Depression   . HYPERLIPIDEMIA, MILD     not on medication  . MORTON'S NEUROMA, LEFT   . TESTOSTERONE DEFICIENCY   . Vitamin B12 deficiency 12/17/2012    Lab Results Component Value Date  VITAMINB12 250 12/17/2012      Past Surgical History  Procedure Laterality Date  . Cesarean section  5/91, 6/93, 7/02  . Tubal ligation      Current Outpatient Prescriptions  Medication Sig Dispense Refill  . Cholecalciferol (VITAMIN D) 1000 UNITS capsule Take 1,000 Units by mouth daily.    . Cyanocobalamin 2500 MCG TABS Take 2,500 mcg by mouth daily.    . metoprolol succinate (TOPROL-XL) 25 MG 24 hr tablet Take 1 tablet (25 mg total) by mouth daily. 90 tablet 3  . naproxen sodium (ANAPROX) 220 MG tablet Take 220 mg by mouth daily.    . NONFORMULARY OR COMPOUNDED ITEM Topical testosterone propionate 2% cream.  Apply 1/8 to inner thighs two times weekly.  Disp 60 gms. 1 each 1  .  norethindrone (MICRONOR,CAMILA,ERRIN) 0.35 MG tablet Take 1 tablet (0.35 mg total) by mouth daily. 3 Package 4  . PARoxetine (PAXIL) 10 MG tablet Take 1 tablet (10 mg total) by mouth daily. 90 tablet 3   No current facility-administered medications for this visit.    Family History  Problem Relation Age of Onset  . Hypertension Father   . Hypertension Brother     ROS:  Pertinent items are noted in HPI.  Otherwise, a comprehensive ROS was negative.  Exam:   BP 110/72 mmHg  Pulse 84  Resp 18  Ht 5\' 8"  (1.727 m)  Wt 212 lb (96.163 kg)  BMI 32.24 kg/m2  LMP 12/14/2014 (Exact Date)  Weight change: -8#   Height: 5\' 8"  (172.7 cm)  Ht Readings from Last 3 Encounters:  01/01/15 5\' 8"  (1.727 m)  04/09/14 5\' 8"  (1.727 m)  03/31/14 5\' 8"  (1.727 m)    General appearance: alert, cooperative and appears stated age Head: Normocephalic, without obvious abnormality, atraumatic Neck: no adenopathy, supple, symmetrical, trachea midline and thyroid normal to inspection and palpation Lungs: clear to auscultation bilaterally Breasts: normal appearance, no masses or tenderness Heart: regular rate and rhythm Abdomen: soft, non-tender; bowel sounds normal; no masses,  no organomegaly Extremities: extremities normal, atraumatic, no cyanosis or edema Skin: Skin color, texture, turgor  normal. No rashes or lesions Lymph nodes: Cervical, supraclavicular, and axillary nodes normal. No abnormal inguinal nodes palpated Neurologic: Grossly normal   Pelvic: External genitalia:  no lesions              Urethra:  normal appearing urethra with no masses, tenderness or lesions              Bartholins and Skenes: normal                 Vagina: normal appearing vagina with normal color and discharge, no lesions              Cervix: no lesions              Pap taken: Yes.   Bimanual Exam:  Uterus:  normal size, contour, position, consistency, mobility, non-tender              Adnexa: normal adnexa and no mass,  fullness, tenderness               Rectovaginal: Confirms               Anus:  normal sphincter tone, no lesions  Chaperone was present for exam.  A:  Well Woman with normal exam Perimenopausal. Precautions for bleeding changes given. H/O palpitations with normal echo 2012 and 2014.  Followed by Dr. Kirke Corin yearly.  Would like second opinion.  Referral to Dr. Jacinto Halim made.  Ocular migraine earlier this year.  P: Mammogram yearly.Appt is scheduled. pap smear obtained. H/o neg pap with neg HR HPV 11/14.   CMP, TSH, Vit D, Lipids FSH and total testosterone.  No RF needed for topical testosterone right now.  (pt using 0.77ml of rx right now /ml) Paxil  daily.  Rx to pharmacy. Micronor rx to pharmacy as well.   Return annually or prn

## 2015-01-02 LAB — FOLLICLE STIMULATING HORMONE: FSH: 10.3 m[IU]/mL

## 2015-01-02 LAB — TSH: TSH: 3.951 u[IU]/mL (ref 0.350–4.500)

## 2015-01-02 LAB — VITAMIN D 25 HYDROXY (VIT D DEFICIENCY, FRACTURES): Vit D, 25-Hydroxy: 35 ng/mL (ref 30–100)

## 2015-01-02 LAB — TESTOSTERONE: Testosterone: 105 ng/dL — ABNORMAL HIGH (ref 10–70)

## 2015-01-05 ENCOUNTER — Other Ambulatory Visit: Payer: Self-pay | Admitting: Internal Medicine

## 2015-01-05 LAB — IPS PAP TEST WITH REFLEX TO HPV

## 2015-01-18 ENCOUNTER — Telehealth: Payer: Self-pay | Admitting: *Deleted

## 2015-01-18 ENCOUNTER — Other Ambulatory Visit: Payer: Self-pay | Admitting: *Deleted

## 2015-01-18 MED ORDER — METOPROLOL SUCCINATE ER 25 MG PO TB24: 25.0000 mg | ORAL_TABLET | Freq: Every day | ORAL | Status: DC

## 2015-01-18 NOTE — Telephone Encounter (Signed)
Requested Prescriptions   Signed Prescriptions Disp Refills  . metoprolol succinate (TOPROL-XL) 25 MG 24 hr tablet 90 tablet 3    Sig: Take 1 tablet (25 mg total) by mouth daily.    Authorizing Provider: Lorine BearsARIDA, MUHAMMAD A    Ordering User: Kendrick FriesLOPEZ, Markcus Lazenby C

## 2015-01-18 NOTE — Telephone Encounter (Signed)
°*  STAT* If patient is at the pharmacy, call can be transferred to refill team.   1. Which medications need to be refilled? (please list name of each medication and dose if known) Metoprolol   2. Which pharmacy/location (including street and city if local pharmacy) is medication to be sent to? [facility]   3. Do they need a 30 day or 90 day supply? 90 day

## 2015-02-02 ENCOUNTER — Encounter: Payer: Self-pay | Admitting: Obstetrics & Gynecology

## 2015-02-02 ENCOUNTER — Telehealth: Payer: Self-pay | Admitting: *Deleted

## 2015-02-02 NOTE — Telephone Encounter (Signed)
Patient called back and states she had to cancel both appts for different reasons and the second time she canceled "they were rude" about it. Patient states she will ask around to get someone else. Advised pt that once she gets a new cardiologist to have them send [place] a report. Pt verbalized understanding.   Dr. Gloris Ham  Encounter closed.

## 2015-02-02 NOTE — Telephone Encounter (Signed)
Left voicemail for pt to call back re: referral to cardiology.  Patient canceled new appt for 01/14/15 and 01/21/15 with Alyssa Pratt.   "... See if she decided to go somewhere else?" per Dr. Hyacinth Meeker

## 2015-04-12 ENCOUNTER — Encounter: Payer: Self-pay | Admitting: Cardiovascular Disease

## 2015-04-12 ENCOUNTER — Ambulatory Visit (INDEPENDENT_AMBULATORY_CARE_PROVIDER_SITE_OTHER): Payer: BLUE CROSS/BLUE SHIELD | Admitting: Cardiovascular Disease

## 2015-04-12 VITALS — BP 108/70 | HR 84 | Ht 68.0 in | Wt 199.0 lb

## 2015-04-12 DIAGNOSIS — R002 Palpitations: Secondary | ICD-10-CM | POA: Diagnosis not present

## 2015-04-12 DIAGNOSIS — I498 Other specified cardiac arrhythmias: Secondary | ICD-10-CM

## 2015-04-12 NOTE — Progress Notes (Signed)
Cardiology Office Note   Date:  04/12/2015   ID:  Alyssa Pratt, DOB February 06, 1965, MRN [id number]  PCP:  Rene Paci, MD  Cardiologist:   Lorine Bears, MD   Chief Complaint  Patient presents with  . other    1 year follow up. Meds reviewed by the patient verbally. "doing well."       History of Present Illness: Alyssa Pratt is a 50 y.o. female who presents for a followup visit regarding palpitations due to documented PACs. She had an echocardiogram done in 2012 and 2014 which showed normal LV systolic function without significant structural or valvular abnormalities. She had a Holter monitor done which showed few PACs without any other significant arrhythmia. She had sinus tachycardia with slightly elevated mean average heart rate. The patient's symptoms seems to correlate mostly with anxiety. She underwent a treadmill stress test in 2013 which showed no evidence of ischemia. She was started on Toprol 25 mg once daily. Her symptoms improved but she continued to have tachycardia. Thus, I proceeded with a 14 day outpatient telemetry which basically showed frequent PACs with no other significant arrhythmia. Her symptoms of palpitations correlated with PACs.  Anxiety has been controlled with Paxil. She has been doing very well overall with stable mild palpitations. She reports rare episodes of substernal chest pain described as sharp discomfort lasting for a few seconds usually at rest.   Past Medical History  Diagnosis Date  . Palpitations     sinus tach and PACs on holter and normal echo 02/2010 , no ischemia ETT 05/2011  . GERD (gastroesophageal reflux disease)   . Anxiety   . Elevated blood pressure     hx  . Depression   . HYPERLIPIDEMIA, MILD     not on medication  . MORTON'S NEUROMA, LEFT   . TESTOSTERONE DEFICIENCY   . Vitamin B12 deficiency 12/17/2012    Lab Results Component Value Date  VITAMINB12 250 12/17/2012      Past Surgical History  Procedure Laterality Date    . Cesarean section  5/91, 6/93, 7/02  . Tubal ligation       Current Outpatient Prescriptions  Medication Sig Dispense Refill  . Cholecalciferol (VITAMIN D) 1000 UNITS capsule Take 1,000 Units by mouth daily.    . Cyanocobalamin 2500 MCG TABS Take 2,500 mcg by mouth daily.    . metoprolol succinate (TOPROL-XL) 25 MG 24 hr tablet Take 1 tablet (25 mg total) by mouth daily. 90 tablet 3  . naproxen sodium (ANAPROX) 220 MG tablet Take 220 mg by mouth daily.    . NONFORMULARY OR COMPOUNDED ITEM Topical testosterone propionate 2% cream.  Apply 1/8 to inner thighs two times weekly.  Disp 60 gms. 1 each 1  . norethindrone (MICRONOR,CAMILA,ERRIN) 0.35 MG tablet Take 1 tablet (0.35 mg total) by mouth daily. 3 Package 4  . PARoxetine (PAXIL) 10 MG tablet Take 1 tablet (10 mg total) by mouth daily. 90 tablet 3   No current facility-administered medications for this visit.    Allergies:   Latex    Social History:  The patient  reports that she has never smoked. She has never used smokeless tobacco. She reports that she does not drink alcohol or use illicit drugs.   Family History:  The patient's family history includes Hypertension in her brother and father.    ROS:  Please see the history of present illness.   Otherwise, review of systems are positive for none.   All other systems  are reviewed and negative.    PHYSICAL EXAM: VS:  BP 108/70 mmHg  Pulse 84  Ht 5\' 8"  (1.727 m)  Wt 199 lb (90.266 kg)  BMI 30.26 kg/m2 , BMI Body mass index is 30.26 kg/(m^2). GEN: Well nourished, well developed, in no acute distress HEENT: normal Neck: no JVD, carotid bruits, or masses Cardiac: RRR; no murmurs, rubs, or gallops,no edema  Respiratory:  clear to auscultation bilaterally, normal work of breathing GI: soft, nontender, nondistended, + BS MS: no deformity or atrophy Skin: warm and dry, no rash Neuro:  Strength and sensation are intact Psych: euthymic mood, full affect   EKG:  EKG is ordered  today. The ekg ordered today demonstrates normal sinus rhythm with low voltage and no significant premature beats.   Recent Labs: 01/01/2015: ALT 25; BUN 14; Creat 0.81; Potassium 4.0; Sodium 136; TSH 3.951    Lipid Panel    Component Value Date/Time   CHOL 173 01/01/2015 1146   TRIG 114 01/01/2015 1146   HDL 34* 01/01/2015 1146   CHOLHDL 5.1* 01/01/2015 1146   VLDL 23 01/01/2015 1146   LDLCALC 116 01/01/2015 1146      Wt Readings from Last 3 Encounters:  04/12/15 199 lb (90.266 kg)  01/01/15 212 lb (96.163 kg)  04/09/14 207 lb 8 oz (94.121 kg)        ASSESSMENT AND PLAN:  1.  Symptomatic PACs: Symptoms are well-controlled with Toprol and Paxil for anxiety.  2. Atypical chest pain: Seems to be muscle spasms. She was reassured.    Disposition:   FU with me in 1 year  Signed,  Lorine BearsMuhammad Jennyfer Nickolson, MD  04/12/2015 10:37 AM    [facility]

## 2015-04-12 NOTE — Patient Instructions (Signed)
Medication Instructions: Continue same medications.   Labwork: None.   Procedures/Testing: None.   Follow-Up: 1 year with Dr. Kamren Heintzelman.   Any Additional Special Instructions Will Be Listed Below (If Applicable).     If you need a refill on your cardiac medications before your next appointment, please call your pharmacy.   

## 2016-01-11 ENCOUNTER — Other Ambulatory Visit: Payer: Self-pay | Admitting: Obstetrics & Gynecology

## 2016-01-11 ENCOUNTER — Other Ambulatory Visit: Payer: Self-pay | Admitting: Cardiovascular Disease

## 2016-01-11 NOTE — Telephone Encounter (Signed)
Medication refill request: PARoxetine 10mg  Last AEX:  01/01/15 SM Next AEX: 04/21/16 Last MMG (if hormonal medication request): 01/01/15 BIRADS 1 negative Refill authorized: 01/01/15 #90 w/3refills; today please advise

## 2016-04-20 NOTE — Progress Notes (Signed)
51 y.o. G3P3 MarriedCaucasianF here for annual exam.  Still having some palpitations and chest pain.  This does not typically happen with exercise.  She did see a cardiologist in [place] but this wasn't a great visit, Dr. Kirke Corin.  Has a friend who sees Dr. Bing Matter and would like a second opinion with him.    Cycles have changed some.  She is skipping cycles.  The March cycle was after skipping for three months.  Flow was not particularly heavy.    Patient's last menstrual period was 03/05/2016.          Sexually active: Yes.    The current method of family planning is tubal ligation.    Exercising: Yes.    cardio, pilates, yoga Smoker:  no  Health Maintenance: Pap:  01/01/15 Neg.  11/15/12 Neg. HR HPV:Neg  History of abnormal Pap:  no MMG:  01/05/15 BIRADS1:neg  Colonoscopy:  05/19/10 Normal. f/u 10 years  BMD:  Never TDaP:  11/2009 Pneumonia vaccine(s):  N/A Zostavax:   N/A Hep C testing: N/A Screening Labs: Here, Hb today: 12.9   reports that she has never smoked. She has never used smokeless tobacco. She reports that she does not drink alcohol or use drugs.  Past Medical History:  Diagnosis Date  . Anxiety   . Depression   . Elevated blood pressure    hx  . GERD (gastroesophageal reflux disease)   . HYPERLIPIDEMIA, MILD    not on medication  . MORTON'S NEUROMA, LEFT   . Palpitations    sinus tach and PACs on holter and normal echo 02/2010 , no ischemia ETT 05/2011  . TESTOSTERONE DEFICIENCY   . Vitamin B12 deficiency 12/17/2012   Lab Results Component Value Date  VITAMINB12 250 12/17/2012      Past Surgical History:  Procedure Laterality Date  . CESAREAN SECTION  5/91, 6/93, 7/02  . TUBAL LIGATION      Current Outpatient Prescriptions  Medication Sig Dispense Refill  . Cholecalciferol (VITAMIN D) 1000 UNITS capsule Take 1,000 Units by mouth daily.    . Cyanocobalamin 2500 MCG TABS Take 2,500 mcg by mouth daily.    . metoprolol succinate (TOPROL-XL) 25 MG 24 hr  tablet TAKE 1 TABLET ONCE DAILY. 90 tablet 3  . naproxen sodium (ANAPROX) 220 MG tablet Take 220 mg by mouth daily.    Marland Kitchen PARoxetine (PAXIL) 10 MG tablet TAKE 1 TABLET ONCE DAILY. 90 tablet 0   No current facility-administered medications for this visit.     Family History  Problem Relation Age of Onset  . Hypertension Father   . Hypertension Brother     ROS:  Pertinent items are noted in HPI.  Otherwise, a comprehensive ROS was negative.  Exam:   BP 106/70 (BP Location: Right Arm, Patient Position: Sitting, Cuff Size: Large)   Pulse 84   Resp 16   Ht 5' 7.25" (1.708 m)   Wt 202 lb (91.6 kg)   LMP 03/05/2016   BMI 31.40 kg/m   Weight change: -2# Height: 5' 7.25" (170.8 cm)  Ht Readings from Last 3 Encounters:  04/21/16 5' 7.25" (1.708 m)  04/12/15  (1.727 m)  01/01/15  (1.727 m)    General appearance: alert, cooperative and appears stated age Head: Normocephalic, without obvious abnormality, atraumatic Neck: no adenopathy, supple, symmetrical, trachea midline and thyroid normal to inspection and palpation Lungs: clear to auscultation bilaterally Breasts: normal appearance, no masses or tenderness Heart: regular rate and rhythm Abdomen: soft,  non-tender; bowel sounds normal; no masses,  no organomegaly Extremities: extremities normal, atraumatic, no cyanosis or edema Skin: Skin color, texture, turgor normal. No rashes or lesions Lymph nodes: Cervical, supraclavicular, and axillary nodes normal. No abnormal inguinal nodes palpated Neurologic: Grossly normal   Pelvic: External genitalia:  no lesions              Urethra:  normal appearing urethra with no masses, tenderness or lesions              Bartholins and Skenes: normal                 Vagina: normal appearing vagina with normal color and discharge, no lesions              Cervix: no lesions              Pap taken: Yes.   Bimanual Exam:  Uterus:  normal size, contour, position, consistency, mobility,  non-tender              Adnexa: normal adnexa and no mass, fullness, tenderness               Rectovaginal: Confirms               Anus:  normal sphincter tone, no lesions  Chaperone was present for exam.  A:  Well Woman with normal exam Perimenopausal.  Bleeding precautions given Palpitations and intermittent chest pain Increased pain with intercourse  P:   Mammogram guidelines reviewed pap smear and HR HPV obtained today CBC, CMP, Lipids, TSH, Vit D obtained today  RF for paxil  daily.  #90/3RF and Toprol XL  daily.  #90/3RF Referral to cardiologist for second opinion Will try Vit E suppositories vaginally two to three times weekly.  Will send to compounding pharmacy. return annually or prn

## 2016-04-21 ENCOUNTER — Other Ambulatory Visit (HOSPITAL_COMMUNITY)
Admission: RE | Admit: 2016-04-21 | Discharge: 2016-04-21 | Disposition: A | Discharge status: Home / Self Care | Payer: BLUE CROSS/BLUE SHIELD | Source: Ambulatory Visit | Attending: Obstetrics & Gynecology | Admitting: Obstetrics & Gynecology

## 2016-04-21 ENCOUNTER — Ambulatory Visit (INDEPENDENT_AMBULATORY_CARE_PROVIDER_SITE_OTHER): Payer: BLUE CROSS/BLUE SHIELD | Admitting: Obstetrics & Gynecology

## 2016-04-21 ENCOUNTER — Encounter: Payer: Self-pay | Admitting: Obstetrics & Gynecology

## 2016-04-21 VITALS — BP 106/70 | HR 84 | Resp 16 | Ht 67.25 in | Wt 202.0 lb

## 2016-04-21 DIAGNOSIS — Z01419 Encounter for gynecological examination (general) (routine) without abnormal findings: Secondary | ICD-10-CM | POA: Insufficient documentation

## 2016-04-21 DIAGNOSIS — R002 Palpitations: Secondary | ICD-10-CM

## 2016-04-21 DIAGNOSIS — Z1151 Encounter for screening for human papillomavirus (HPV): Secondary | ICD-10-CM | POA: Diagnosis not present

## 2016-04-21 DIAGNOSIS — Z124 Encounter for screening for malignant neoplasm of cervix: Secondary | ICD-10-CM | POA: Diagnosis not present

## 2016-04-21 DIAGNOSIS — Z0000 Encounter for general adult medical examination without abnormal findings: Secondary | ICD-10-CM

## 2016-04-21 LAB — CBC
HCT: 41.2 % (ref 35.0–45.0)
Hemoglobin: 13.8 g/dL (ref 11.7–15.5)
MCH: 30.7 pg (ref 27.0–33.0)
MCHC: 33.5 g/dL (ref 32.0–36.0)
MCV: 91.6 fL (ref 80.0–100.0)
MPV: 9.7 fL (ref 7.5–12.5)
Platelets: 260 10*3/uL (ref 140–400)
RBC: 4.5 MIL/uL (ref 3.80–5.10)
RDW: 13.1 % (ref 11.0–15.0)
WBC: 5.6 10*3/uL (ref 3.8–10.8)

## 2016-04-21 LAB — LIPID PANEL
Cholesterol: 185 mg/dL (ref <200)
HDL: 41 mg/dL — ABNORMAL LOW (ref >50)
LDL Cholesterol: 120 mg/dL — ABNORMAL HIGH (ref <100)
Total CHOL/HDL Ratio: 4.5 Ratio (ref <5.0)
Triglycerides: 120 mg/dL (ref <150)
VLDL: 24 mg/dL (ref <30)

## 2016-04-21 LAB — COMPREHENSIVE METABOLIC PANEL
ALT: 20 U/L (ref 6–29)
AST: 17 U/L (ref 10–35)
Albumin: 4.2 g/dL (ref 3.6–5.1)
Alkaline Phosphatase: 66 U/L (ref 33–130)
BUN: 15 mg/dL (ref 7–25)
CO2: 28 mmol/L (ref 20–31)
Calcium: 9.7 mg/dL (ref 8.6–10.4)
Chloride: 106 mmol/L (ref 98–110)
Creat: 0.93 mg/dL (ref 0.50–1.05)
Glucose, Bld: 63 mg/dL — ABNORMAL LOW (ref 65–99)
Potassium: 4 mmol/L (ref 3.5–5.3)
Sodium: 141 mmol/L (ref 135–146)
Total Bilirubin: 0.4 mg/dL (ref 0.2–1.2)
Total Protein: 6.5 g/dL (ref 6.1–8.1)

## 2016-04-21 LAB — HEMOGLOBIN, FINGERSTICK: Hemoglobin, fingerstick: 12.9 g/dL (ref 12.0–15.0)

## 2016-04-21 LAB — TSH: TSH: 3.05 mIU/L

## 2016-04-21 MED ORDER — PAROXETINE HCL 10 MG PO TABS: 10.0000 mg | ORAL_TABLET | Freq: Every day | ORAL | 3 refills | Status: DC

## 2016-04-21 MED ORDER — METOPROLOL SUCCINATE ER 25 MG PO TB24: 25.0000 mg | ORAL_TABLET | Freq: Every day | ORAL | 3 refills | Status: DC

## 2016-04-22 ENCOUNTER — Encounter: Payer: Self-pay | Admitting: Obstetrics & Gynecology

## 2016-04-22 LAB — VITAMIN D 25 HYDROXY (VIT D DEFICIENCY, FRACTURES): Vit D, 25-Hydroxy: 29 ng/mL — ABNORMAL LOW (ref 30–100)

## 2016-04-24 ENCOUNTER — Other Ambulatory Visit: Payer: Self-pay | Admitting: Obstetrics & Gynecology

## 2016-04-24 LAB — CYTOLOGY - PAP
Diagnosis: NEGATIVE
HPV: NOT DETECTED

## 2016-04-24 MED ORDER — NONFORMULARY OR COMPOUNDED ITEM: 3 refills | Status: DC

## 2016-06-23 ENCOUNTER — Encounter: Payer: Self-pay | Admitting: Obstetrics & Gynecology

## 2016-07-12 ENCOUNTER — Telehealth: Payer: Self-pay

## 2016-07-12 ENCOUNTER — Encounter: Payer: Self-pay | Admitting: Obstetrics & Gynecology

## 2016-07-12 NOTE — Telephone Encounter (Signed)
Spoke with patient. Patient states that she has decided she will call to schedule an appointment for Dr.Arida. If she would like a new referral will contact the office.  Non-Urgent Medical Question  Message [phone]  From Alyssa PieriniStacy Pratt To Jerene BearsMary S Miller, MD Sent 07/12/2016 2:02 PM  Dr. Hyacinth MeekerMiller - I'm sorry to say that the referral you did for me to see cardiologist, Dr. Gypsy Balsamobert Krasowski fell thru. Seems like this cardiologist referral just isn't going to happen for me - They called me from his office and made the appointment for me with him to their office in [place] and then they later sent me an automated message that said I would be seeing a different doctor and even gave me a different appointment time - I contacted their office and they said Dr. Bing MatterKrasowski had left their practice so..... I'm just going to leave this referral and not worry about it - I'm probably going to make an appointment with my cardiologist in [place]. Thank you for all you do for me. Alyssa PieriniStacy Pratt   Responsible Party   Pool - [facility] Clinical Pool No one has taken responsibility for this message.  No actions have been taken on this message.   Routing to provider for final review. Patient agreeable to disposition. Will close encounter.

## 2016-07-12 NOTE — Telephone Encounter (Signed)
See telephone encounter dated 07/12/16.

## 2016-10-15 ENCOUNTER — Encounter: Payer: Self-pay | Admitting: Obstetrics & Gynecology

## 2016-10-16 ENCOUNTER — Telehealth: Payer: Self-pay | Admitting: Obstetrics & Gynecology

## 2016-10-16 NOTE — Telephone Encounter (Signed)
Sent to [facility] to call [facility] and assist with scheduling new patient appointment.   Routing to provider for final review. Patient agreeable to disposition. Will close encounter.

## 2016-10-16 NOTE — Telephone Encounter (Signed)
Called patient and spoke with her about her request sent through MyChart below. Obtained a phone number for her friend and will call her to schedule her with Dr. Hyacinth Meeker. Closing encounter. Routing to Dr. Hyacinth Meeker for FYI only.   Non-Urgent Medical Question  Message [phone]  From Dericka Ostenson To Jerene Bears, MD Sent 10/15/2016 8:03 AM  Dr Hyacinth Meeker.... I have a tremendous favor/request ..... I have a sweet friend from church that is going through some issues with her health in regard to hormones/birth control and the dr she has been going to has not been able to help her .... I sat across from her yesterday talking and she said that she is urgently needing to see a dr .... her hormones are crazy .Marland Kitchen.. she feels terrible .Marland Kitchen... has not had a period for 4 months and is not pregnant .... as we sat there she told me she needs to see someone and needs someone soon but has no idea who to go to and is struggling to get into dr .... she is only 51 years old ... I know you were not taking on new patients but is there any way at all that you could see my friend? And I would say quickly? I know this is a huge request but you are who she needs!!!!! 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 thanks for reading and considering (she does have insurance as far as I know)   Called patient and spoke with her about her request. Obtained a phone number for her friend and will call her to schedule her with Dr. Hyacinth Meeker.

## 2017-02-27 ENCOUNTER — Other Ambulatory Visit: Payer: Self-pay | Admitting: Cardiovascular Disease

## 2017-03-01 ENCOUNTER — Ambulatory Visit: Payer: BLUE CROSS/BLUE SHIELD | Admitting: Cardiovascular Disease

## 2017-03-01 ENCOUNTER — Encounter: Payer: Self-pay | Admitting: Cardiovascular Disease

## 2017-03-01 VITALS — BP 106/60 | HR 84 | Ht 68.0 in | Wt 207.5 lb

## 2017-03-01 DIAGNOSIS — R079 Chest pain, unspecified: Secondary | ICD-10-CM | POA: Diagnosis not present

## 2017-03-01 DIAGNOSIS — E785 Hyperlipidemia, unspecified: Secondary | ICD-10-CM

## 2017-03-01 DIAGNOSIS — R002 Palpitations: Secondary | ICD-10-CM | POA: Diagnosis not present

## 2017-03-01 MED ORDER — METOPROLOL SUCCINATE ER 25 MG PO TB24: 25.0000 mg | ORAL_TABLET | Freq: Every day | ORAL | 3 refills | Status: DC

## 2017-03-01 NOTE — Patient Instructions (Addendum)
Medication Instructions:  Your physician recommends that you continue on your current medications as directed. Please refer to the Current Medication list given to you today.   Labwork: none  Testing/Procedures: Your physician has requested that you have an exercise tolerance test. For further information please visit [email or web address] Please also follow instruction sheet, as given.  Do not take metoprolol the morning of your test. Wear comfortable walking clothes and shoes. Avoid caffeine and smoking 24 hours before.   Follow-Up: Your physician wants you to follow-up in: 1 year with Dr. [name].  You will receive a reminder letter in the mail two months in advance. If you don't receive a letter, please call our office to schedule the follow-up appointment.   Any Other Special Instructions Will Be Listed Below (If Applicable).     If you need a refill on your cardiac medications before your next appointment, please call your pharmacy.   Exercise Stress Electrocardiogram An exercise stress electrocardiogram is a test that is done to evaluate the blood supply to your heart. This test may also be called exercise stress electrocardiography. The test is done while you are walking on a treadmill. The goal of this test is to raise your heart rate. This test is done to find areas of poor blood flow to the heart by determining the extent of coronary artery disease (CAD). CAD is defined as narrowing in one or more heart (coronary) arteries of more than 70%. If you have an abnormal test result, this may mean that you are not getting adequate blood flow to your heart during exercise. Additional testing may be needed to understand why your test was abnormal. Tell a health care provider about:  Any allergies you have.  All medicines you are taking, including vitamins, herbs, eye drops, creams, and over-the-counter medicines.  Any problems you or family members have had with anesthetic  medicines.  Any blood disorders you have.  Any surgeries you have had.  Any medical conditions you have.  Possibility of pregnancy, if this applies. What are the risks? Generally, this is a safe procedure. However, as with any procedure, complications can occur. Possible complications can include:  Pain or pressure in the following areas: ? Chest. ? Jaw or neck. ? Between your shoulder blades. ? Radiating down your left arm.  Dizziness or light-headedness.  Shortness of breath.  Increased or irregular heartbeats.  Nausea or vomiting.  Heart attack (rare).  What happens before the procedure?  Avoid all forms of caffeine 24 hours before your test or as directed by your health care provider. This includes coffee, tea (even decaffeinated tea), caffeinated sodas, chocolate, cocoa, and certain pain medicines.  Follow your health care provider's instructions regarding eating and drinking before the test.  Take your medicines as directed at regular times with water unless instructed otherwise. Exceptions may include: ? If you have diabetes, ask how you are to take your insulin or pills. It is common to adjust insulin dosing the morning of the test. ? If you are taking beta-blocker medicines, it is important to talk to your health care provider about these medicines well before the date of your test. Taking beta-blocker medicines may interfere with the test. In some cases, these medicines need to be changed or stopped 24 hours or more before the test. ? If you wear a nitroglycerin patch, it may need to be removed prior to the test. Ask your health care provider if the patch should be removed before the test.  If  you use an inhaler for any breathing condition, bring it with you to the test.  If you are an outpatient, bring a snack so you can eat right after the stress phase of the test.  Do not smoke for 4 hours prior to the test or as directed by your health care provider.  Do not  apply lotions, powders, creams, or oils on your chest prior to the test.  Wear loose-fitting clothes and comfortable shoes for the test. This test involves walking on a treadmill. What happens during the procedure?  Multiple patches (electrodes) will be put on your chest. If needed, small areas of your chest may have to be shaved to get better contact with the electrodes. Once the electrodes are attached to your body, multiple wires will be attached to the electrodes and your heart rate will be monitored.  Your heart will be monitored both at rest and while exercising.  You will walk on a treadmill. The treadmill will be started at a slow pace. The treadmill speed and incline will gradually be increased to raise your heart rate. What happens after the procedure?  Your heart rate and blood pressure will be monitored after the test.  You may return to your normal schedule including diet, activities, and medicines, unless your health care provider tells you otherwise. This information is not intended to replace advice given to you by your health care provider. Make sure you discuss any questions you have with your health care provider. Document Released: 12/17/1999 Document Revised: 05/27/2015 Document Reviewed: 08/26/2012 Elsevier Interactive Patient Education  2017 [employer].

## 2017-03-01 NOTE — Progress Notes (Signed)
Cardiology Office Note   Date:  03/01/2017   ID:  Alyssa PieriniStacy Pratt, DOB 10/08/1965, MRN [id number]  PCP:  Rhodia Albrightampbell, Sabrina White, NP  Cardiologist:   Lorine BearsMuhammad Eldrige Pitkin, MD   Chief Complaint  Patient presents with  . Other    OD 12 month follow up. Patient states she has fast squeezing pain ion chest and would like to discuss them. Patient was last seen 04/12/2015. Meds reviewed verbally with patient.       History of Present Illness: Alyssa Pratt is a 52 y.o. female who presents for a followup visit regarding palpitations due to documented PACs. She had an echocardiogram done in 2012 and 2014 which showed normal LV systolic function without significant structural or valvular abnormalities. She had a Holter monitor done which showed few PACs without any other significant arrhythmia. She had sinus tachycardia with slightly elevated mean average heart rate.  She underwent a treadmill stress test in 2013 which showed no evidence of ischemia.  Her symptoms have been well controlled on small dose Toprol.  She has been doing well overall with no palpitations.  However, she reports increased frequency of substernal chest discomfort described as tightness that last for less than half a minute and happens at rest.  She has no exertional symptoms.  Past Medical History:  Diagnosis Date  . Anxiety   . Depression   . Elevated blood pressure    hx  . GERD (gastroesophageal reflux disease)   . HYPERLIPIDEMIA, MILD    not on medication  . MORTON'S NEUROMA, LEFT   . Palpitations    sinus tach and PACs on holter and normal echo 02/2010 , no ischemia ETT 05/2011  . TESTOSTERONE DEFICIENCY   . Vitamin B12 deficiency 12/17/2012   Lab Results Component Value Date  VITAMINB12 250 12/17/2012      Past Surgical History:  Procedure Laterality Date  . CESAREAN SECTION  5/91, 6/93, 7/02  . TUBAL LIGATION       Current Outpatient Medications  Medication Sig Dispense Refill  . Cholecalciferol (VITAMIN D)  1000 UNITS capsule Take 1,000 Units by mouth daily.    . metoprolol succinate (TOPROL-XL) 25 MG 24 hr tablet Take 1 tablet (25 mg total) by mouth daily. 90 tablet 3  . naproxen sodium (ANAPROX) 220 MG tablet Take 220 mg by mouth daily.    Marland Kitchen. PARoxetine (PAXIL) 10 MG tablet Take 1 tablet (10 mg total) by mouth daily. 90 tablet 3   No current facility-administered medications for this visit.     Allergies:   Latex    Social History:  The patient  reports that  has never smoked. she has never used smokeless tobacco. She reports that she does not drink alcohol or use drugs.   Family History:  The patient's family history includes Hypertension in her brother and father.    ROS:  Please see the history of present illness.   Otherwise, review of systems are positive for none.   All other systems are reviewed and negative.    PHYSICAL EXAM: VS:  BP 106/60 (BP Location: Left Arm, Patient Position: Sitting, Cuff Size: Normal)   Pulse 84   Ht 5\' 8"  (1.727 m)   Wt 207 lb 8 oz (94.1 kg)   BMI 31.55 kg/m  , BMI Body mass index is 31.55 kg/m. GEN: Well nourished, well developed, in no acute distress  HEENT: normal  Neck: no JVD, carotid bruits, or masses Cardiac: RRR; no murmurs, rubs, or gallops,no edema  Respiratory:  clear to auscultation bilaterally, normal work of breathing GI: soft, nontender, nondistended, + BS MS: no deformity or atrophy  Skin: warm and dry, no rash Neuro:  Strength and sensation are intact Psych: euthymic mood, full affect   EKG:  EKG is ordered today. The ekg ordered today demonstrates normal sinus rhythm with no significant ST or T wave changes.  Recent Labs: 04/21/2016: ALT 20; BUN 15; Creat 0.93; Hemoglobin 13.8; Platelets 260; Potassium 4.0; Sodium 141; TSH 3.05    Lipid Panel    Component Value Date/Time   CHOL 185 04/21/2016 0956   TRIG 120 04/21/2016 0956   HDL 41 (L) 04/21/2016 0956   CHOLHDL 4.5 04/21/2016 0956   VLDL 24 04/21/2016 0956    LDLCALC 120 (H) 04/21/2016 0956      Wt Readings from Last 3 Encounters:  03/01/17 207 lb 8 oz (94.1 kg)  04/21/16 202 lb (91.6 kg)  04/12/15 199 lb (90.3 kg)        ASSESSMENT AND PLAN:  1.  Symptomatic PACs: Symptoms are well-controlled with Toprol and Paxil for anxiety.  2. Atypical chest pain: Chronic with increased frequency recently.  Most likely musculoskeletal but no recent ischemic evaluation.  I requested a treadmill stress test.  3.  Mild hyperlipidemia: We can consider coronary calcium score in the future to help [place] determine if treatment is needed.    Disposition:   FU with me in 1 year  Signed,  Lorine Bears, MD  03/01/2017 8:14 AM    [facility]

## 2017-03-05 ENCOUNTER — Telehealth: Payer: Self-pay | Admitting: Cardiovascular Disease

## 2017-03-05 NOTE — Telephone Encounter (Signed)
Called patient to review GXT instructions.  Left message for patient to contact the office.

## 2017-03-06 ENCOUNTER — Ambulatory Visit (INDEPENDENT_AMBULATORY_CARE_PROVIDER_SITE_OTHER): Payer: BLUE CROSS/BLUE SHIELD

## 2017-03-06 DIAGNOSIS — R079 Chest pain, unspecified: Secondary | ICD-10-CM

## 2017-03-09 LAB — EXERCISE TOLERANCE TEST
Estimated workload: 8.5 METS
Exercise duration (min): 7 min
Exercise duration (sec): 1 s
MPHR: 167 {beats}/min
Peak HR: 150 {beats}/min
Percent HR: 89 %
Rest HR: 93 {beats}/min

## 2017-03-13 ENCOUNTER — Encounter: Payer: Self-pay | Admitting: Cardiovascular Disease

## 2017-06-29 ENCOUNTER — Other Ambulatory Visit: Payer: Self-pay | Admitting: Obstetrics & Gynecology

## 2017-06-29 NOTE — Telephone Encounter (Signed)
Medication refill request: Paxil 10mg   Last AEX:  04/21/16 Next AEX: 07/20/17 Last MMG Bi-rads Category 1 Negative  Refill authorized: last refilled 04/21/16 gave #90 with 3 refills.

## 2017-07-20 ENCOUNTER — Ambulatory Visit: Payer: BLUE CROSS/BLUE SHIELD | Admitting: Obstetrics & Gynecology

## 2017-07-20 ENCOUNTER — Encounter: Payer: Self-pay | Admitting: Obstetrics & Gynecology

## 2017-07-20 ENCOUNTER — Other Ambulatory Visit: Payer: Self-pay

## 2017-07-20 VITALS — BP 114/80 | HR 76 | Resp 14 | Ht 67.5 in | Wt 210.4 lb

## 2017-07-20 DIAGNOSIS — Z01419 Encounter for gynecological examination (general) (routine) without abnormal findings: Secondary | ICD-10-CM

## 2017-07-20 MED ORDER — PAROXETINE HCL 10 MG PO TABS: 10.0000 mg | ORAL_TABLET | Freq: Every day | ORAL | 4 refills | Status: DC

## 2017-07-20 MED ORDER — ESTROGENS, CONJUGATED 0.625 MG/GM VA CREA: TOPICAL_CREAM | VAGINAL | 4 refills | Status: DC

## 2017-07-20 NOTE — Progress Notes (Signed)
52 y.o. G3P3 MarriedCaucasianF here for annual exam.  Still having the cardiovascular pressure/tightness issues.  Did a stress treadmill and this was negative.  Cycles are very infrequent.  They are not heavy and the last few have been very light.  Patient's last menstrual period was 06/01/2017 (approximate).          Sexually active: Yes.    The current method of family planning is tubal ligation.    Exercising: Yes.    pilates, yoga Smoker:  no  Health Maintenance: Pap:  04/21/16 Neg. HR HPV:neg   01/01/15 Neg  History of abnormal Pap:  no MMG: 04/27/16 BIRADS1:Neg  Colonoscopy:  05/19/10 Normal. F/u 10 years  BMD: Never TDaP:  11/16/09 Pneumonia vaccine(s):  n/a Shingrix:  D/w pt shingrix vaccination today Hep C testing: n/a Screening Labs: Here    reports that she has never smoked. She has never used smokeless tobacco. She reports that she does not drink alcohol or use drugs.  Past Medical History:  Diagnosis Date  . Anxiety   . Depression   . Elevated blood pressure    hx  . GERD (gastroesophageal reflux disease)   . HYPERLIPIDEMIA, MILD    not on medication  . MORTON'S NEUROMA, LEFT   . Palpitations    sinus tach and PACs on holter and normal echo 02/2010 , no ischemia ETT 05/2011  . TESTOSTERONE DEFICIENCY   . Vitamin B12 deficiency 12/17/2012   Lab Results Component Value Date  VITAMINB12 250 12/17/2012      Past Surgical History:  Procedure Laterality Date  . CESAREAN SECTION  5/91, 6/93, 7/02  . TUBAL LIGATION      Current Outpatient Medications  Medication Sig Dispense Refill  . Cholecalciferol (VITAMIN D) 1000 UNITS capsule Take 1,000 Units by mouth daily.    . metoprolol succinate (TOPROL-XL) 25 MG 24 hr tablet Take 1 tablet (25 mg total) by mouth daily. 90 tablet 3  . naproxen sodium (ANAPROX) 220 MG tablet Take 220 mg by mouth daily.    Marland Kitchen PARoxetine (PAXIL) 10 MG tablet TAKE 1 TABLET BY MOUTH DAILY. 90 tablet 0   No current facility-administered  medications for this visit.     Family History  Problem Relation Age of Onset  . Hypertension Father   . Hypertension Brother     Review of Systems  All other systems reviewed and are negative.   Exam:   BP 114/80 (BP Location: Right Arm, Patient Position: Sitting, Cuff Size: Normal)   Pulse 76   Resp 14   Ht 5' 7.5" (1.715 m)   Wt 210 lb 6.4 oz (95.4 kg)   LMP 06/01/2017 (Approximate)   BMI 32.47 kg/m   Height: 5' 7.5" (171.5 cm)  Ht Readings from Last 3 Encounters:  07/20/17 5' 7.5" (1.715 m)  03/01/17 5\' 8"  (1.727 m)  04/21/16 5' 7.25" (1.708 m)    General appearance: alert, cooperative and appears stated age Head: Normocephalic, without obvious abnormality, atraumatic Neck: no adenopathy, supple, symmetrical, trachea midline and thyroid normal to inspection and palpation Lungs: clear to auscultation bilaterally Breasts: normal appearance, no masses or tenderness Heart: regular rate and rhythm Abdomen: soft, non-tender; bowel sounds normal; no masses,  no organomegaly Extremities: extremities normal, atraumatic, no cyanosis or edema Skin: Skin color, texture, turgor normal. No rashes or lesions Lymph nodes: Cervical, supraclavicular, and axillary nodes normal. No abnormal inguinal nodes palpated Neurologic: Grossly normal   Pelvic: External genitalia:  no lesions  Urethra:  normal appearing urethra with no masses, tenderness or lesions              Bartholins and Skenes: normal                 Vagina: normal appearing vagina with normal color and discharge, no lesions              Cervix: no lesions              Pap taken: No. Bimanual Exam:  Uterus:  normal size, contour, position, consistency, mobility, non-tender              Adnexa: normal adnexa and no mass, fullness, tenderness               Rectovaginal: Confirms               Anus:  normal sphincter tone, no lesions  Chaperone was present for exam.  A:  Well Woman with normal  exam Perimenopausal  H/o palpitations and intermittent chest pressure/tightness Vaginal dryness  P:   Mammogram guidelines reviewed pap smear with neg HR HPV 2018.  Not obtained today Will plan fasting lab work next year RF for Paxil 10mg  daily  #90/4RF Rx for premarin vaginal cream 1/2gm pv twice weekly.  #30gram/3RF Return annually or prn

## 2017-07-20 NOTE — Progress Notes (Signed)
Patient is scheduled for 3D screening mammogram 08/07/17 at 0700 at [facility]. Patient scheduled while in office and agreeable to date/time/location.

## 2017-07-24 ENCOUNTER — Telehealth: Payer: Self-pay | Admitting: *Deleted

## 2017-07-24 DIAGNOSIS — E785 Hyperlipidemia, unspecified: Secondary | ICD-10-CM

## 2017-07-24 DIAGNOSIS — R079 Chest pain, unspecified: Secondary | ICD-10-CM

## 2017-07-24 NOTE — Telephone Encounter (Signed)
Left a message for the patient to call back concerning ordering a calcium score test.

## 2017-07-25 NOTE — Telephone Encounter (Signed)
The patient has agreed to have the cardiac score test. Order has been placed. Patient has bene given the number to call and set up the test. She will call when she returns from vacation.

## 2017-08-07 ENCOUNTER — Encounter: Payer: Self-pay | Admitting: Obstetrics & Gynecology

## 2017-08-08 ENCOUNTER — Ambulatory Visit (INDEPENDENT_AMBULATORY_CARE_PROVIDER_SITE_OTHER)
Admission: RE | Admit: 2017-08-08 | Discharge: 2017-08-08 | Disposition: A | Discharge status: Home / Self Care | Payer: Self-pay | Source: Ambulatory Visit | Attending: Cardiovascular Disease | Admitting: Cardiovascular Disease

## 2017-08-08 DIAGNOSIS — E785 Hyperlipidemia, unspecified: Secondary | ICD-10-CM

## 2017-08-08 DIAGNOSIS — R079 Chest pain, unspecified: Secondary | ICD-10-CM

## 2017-08-10 ENCOUNTER — Telehealth: Payer: Self-pay | Admitting: *Deleted

## 2017-08-10 DIAGNOSIS — E785 Hyperlipidemia, unspecified: Secondary | ICD-10-CM

## 2017-08-10 MED ORDER — ATORVASTATIN CALCIUM 10 MG PO TABS: 10.0000 mg | ORAL_TABLET | Freq: Every day | ORAL | 11 refills | Status: DC

## 2017-08-10 NOTE — Telephone Encounter (Signed)
Patient made aware of results and verbalized understanding. Atorvastatin 10 mg daily has been sent in for her and she will come back in 6 weeks for a fasting lipid and liver.

## 2017-08-10 NOTE — Telephone Encounter (Signed)
Left a message to call back.

## 2017-08-10 NOTE — Telephone Encounter (Signed)
Patient calling to discuss recent  CT Calcium score testing results   Please call

## 2017-08-10 NOTE — Telephone Encounter (Signed)
-----   Message from [place] OuchMuhammad A Arida, MD sent at 08/10/2017  7:29 AM EDT ----- Inform patient that coronary calcium score was mildly abnormal.  The score was 7 which is overall low but should be 0 in a normal situation.   Due to that, I recommend starting atorvastatin small dose 10 mg daily to lower her LDL.  Recheck lipid and liver profile in 6 weeks.

## 2017-09-21 ENCOUNTER — Other Ambulatory Visit
Admission: RE | Admit: 2017-09-21 | Discharge: 2017-09-21 | Disposition: A | Discharge status: Home / Self Care | Payer: BLUE CROSS/BLUE SHIELD | Source: Ambulatory Visit | Attending: Cardiovascular Disease | Admitting: Cardiovascular Disease

## 2017-09-21 ENCOUNTER — Telehealth: Payer: Self-pay | Admitting: Cardiovascular Disease

## 2017-09-21 DIAGNOSIS — E785 Hyperlipidemia, unspecified: Secondary | ICD-10-CM | POA: Diagnosis not present

## 2017-09-21 LAB — LIPID PANEL
Cholesterol: 161 mg/dL (ref 0–200)
HDL: 46 mg/dL (ref >40)
LDL Cholesterol: 100 mg/dL — ABNORMAL HIGH (ref 0–99)
Total CHOL/HDL Ratio: 3.5 RATIO
Triglycerides: 75 mg/dL (ref <150)
VLDL: 15 mg/dL (ref 0–40)

## 2017-09-21 LAB — HEPATIC FUNCTION PANEL
ALT: 22 U/L (ref 0–44)
AST: 17 U/L (ref 15–41)
Albumin: 4.5 g/dL (ref 3.5–5.0)
Alkaline Phosphatase: 79 U/L (ref 38–126)
Bilirubin, Direct: <0.1 mg/dL (ref 0.0–0.2)
Total Bilirubin: 0.8 mg/dL (ref 0.3–1.2)
Total Protein: 7.2 g/dL (ref 6.5–8.1)

## 2017-09-21 NOTE — Telephone Encounter (Signed)
Patient calling asking if we can please switch over her lab order so she may do them at the hospital  She plans on going there in a little bit  Please advise

## 2017-09-21 NOTE — Telephone Encounter (Signed)
S/w patient. She wanted to make sure she understood the process for getting [facility]. Patient appreciative and is going there today. She is fasting.

## 2017-10-15 ENCOUNTER — Other Ambulatory Visit: Payer: Self-pay | Admitting: Obstetrics & Gynecology

## 2017-10-15 NOTE — Telephone Encounter (Signed)
Medication refill request: Paxil Last AEX:  07/20/2017 Next AEX: 11/07/2018 Last MMG (if hormonal medication request): n/a Refill authorized: #90, 3 refills

## 2018-01-14 ENCOUNTER — Other Ambulatory Visit: Payer: Self-pay | Admitting: Obstetrics and Gynecology

## 2018-01-14 NOTE — Telephone Encounter (Signed)
Medication refill request: Paxil 10 mg  Last AEX:  07/20/17  Next AEX: 11/07/18  Last MMG (if hormonal medication request): Bi-rads 1 Neg  Refill authorized: #90 with 0 rf

## 2018-04-20 ENCOUNTER — Other Ambulatory Visit: Payer: Self-pay | Admitting: Cardiovascular Disease

## 2018-04-22 ENCOUNTER — Encounter: Payer: Self-pay | Admitting: Cardiovascular Disease

## 2018-04-22 NOTE — Telephone Encounter (Signed)
Virtual Visit Pre-Appointment Phone Call  Steps For Call:  1. Confirm consent - "In the setting of the current Covid19 crisis, you are scheduled for a (phone or video) visit with your provider on (date) at (time).  Just as we do with many in-office visits, in order for you to participate in this visit, we must obtain consent.  If you'd like, I can send this to your mychart (if signed up) or email for you to review.  Otherwise, I can obtain your verbal consent now.  All virtual visits are billed to your insurance company just like a normal visit would be.  By agreeing to a virtual visit, we'd like you to understand that the technology does not allow for your provider to perform an examination, and thus may limit your provider's ability to fully assess your condition. If your provider identifies any concerns that need to be evaluated in person, we will make arrangements to do so.  Finally, though the technology is pretty good, we cannot assure that it will always work on either your or our end, and in the setting of a video visit, we may have to convert it to a phone-only visit.  In either situation, we cannot ensure that we have a secure connection.  Are you willing to proceed?" STAFF: Did the patient verbally acknowledge consent to telehealth visit? Document YES/NO here: Yes   2. Confirm the BEST phone number to call the day of the visit by including in appointment notes  3. Give patient instructions for MyChart download to smartphone OR Doximity/[email or web address] as below if video visit (depending on what platform provider is using)  4. Confirm that appointment type is correct in Epic appointment notes (VIDEO vs PHONE)  5. Advise patient to be prepared with their blood pressure, heart rate, weight, any heart rhythm information, their current medicines, and a piece of paper and pen handy for any instructions they may receive the day of their visit  6. Inform patient they will receive a phone call 15  minutes prior to their appointment time (may be from unknown caller ID) so they should be prepared to answer    TELEPHONE CALL NOTE  Alyssa Pratt has been deemed a candidate for a follow-up tele-health visit to limit community exposure during the Covid-19 pandemic. I spoke with the patient via phone to ensure availability of phone/video source, confirm preferred email & phone number, and discuss instructions and expectations.  I reminded Alyssa Pratt to be prepared with any vital sign and/or heart rhythm information that could potentially be obtained via home monitoring, at the time of her visit. I reminded Alyssa Pratt to expect a phone call prior to her visit.  Alyssa Pratt 04/22/2018 4:21 PM   INSTRUCTIONS FOR DOWNLOADING THE MYCHART APP TO SMARTPHONE  - The patient must first make sure to have activated MyChart and know their login information - If Apple, go to [employer] and type in MyChart in the search bar and download the app. If Android, ask patient to go to [employer] and type in [place] in the search bar and download the app. The app is free but as with any other app downloads, their phone may require them to verify saved payment information or Apple/Android password.  - The patient will need to then log into the app with their MyChart username and password, and select [facility] as their healthcare provider to link the account. When it is time for your visit, go to the  MyChart app, find appointments, and click Begin Video Visit. Be sure to Select Allow for your device to access the Microphone and Camera for your visit. You will then be connected, and your provider will be with you shortly.  **If they have any issues connecting, or need assistance please contact MyChart service desk [phone])**  **If using a computer, in order to ensure the best quality for their visit they will need to use either of the following Internet Browsers: [employer], or Google  Chrome**  IF USING DOXIMITY or DOXY.ME - The patient will receive a link just prior to their visit by text.     FULL LENGTH CONSENT FOR TELE-HEALTH VISIT   I hereby voluntarily request, consent and authorize [facility] and its employed or contracted physicians, physician assistants, nurse practitioners or other licensed health care professionals (the Practitioner), to provide me with telemedicine health care services (the Services") as deemed necessary by the treating Practitioner. I acknowledge and consent to receive the Services by the Practitioner via telemedicine. I understand that the telemedicine visit will involve communicating with the Practitioner through live audiovisual communication technology and the disclosure of certain medical information by electronic transmission. I acknowledge that I have been given the opportunity to request an in-person assessment or other available alternative prior to the telemedicine visit and am voluntarily participating in the telemedicine visit.  I understand that I have the right to withhold or withdraw my consent to the use of telemedicine in the course of my care at any time, without affecting my right to future care or treatment, and that the Practitioner or I may terminate the telemedicine visit at any time. I understand that I have the right to inspect all information obtained and/or recorded in the course of the telemedicine visit and may receive copies of available information for a reasonable fee.  I understand that some of the potential risks of receiving the Services via telemedicine include:   Delay or interruption in medical evaluation due to technological equipment failure or disruption;  Information transmitted may not be sufficient (e.g. poor resolution of images) to allow for appropriate medical decision making by the Practitioner; and/or   In rare instances, security protocols could fail, causing a breach of personal health  information.  Furthermore, I acknowledge that it is my responsibility to provide information about my medical history, conditions and care that is complete and accurate to the best of my ability. I acknowledge that Practitioner's advice, recommendations, and/or decision may be based on factors not within their control, such as incomplete or inaccurate data provided by me or distortions of diagnostic images or specimens that may result from electronic transmissions. I understand that the practice of medicine is not an exact science and that Practitioner makes no warranties or guarantees regarding treatment outcomes. I acknowledge that I will receive a copy of this consent concurrently upon execution via email to the email address I last provided but may also request a printed copy by calling the office of [facility].    I understand that my insurance will be billed for this visit.   I have read or had this consent read to me.  I understand the contents of this consent, which adequately explains the benefits and risks of the Services being provided via telemedicine.   I have been provided ample opportunity to ask questions regarding this consent and the Services and have had my questions answered to my satisfaction.  I give my informed consent for the  services to be provided through the use of telemedicine in my medical care  By participating in this telemedicine visit I agree to the above.

## 2018-04-22 NOTE — Telephone Encounter (Signed)
Past due for 1 year F/U. Please call patient to schedule. Thank you!

## 2018-04-23 ENCOUNTER — Telehealth: Payer: Self-pay | Admitting: Cardiovascular Disease

## 2018-04-23 ENCOUNTER — Other Ambulatory Visit: Payer: Self-pay | Admitting: Cardiovascular Disease

## 2018-04-23 NOTE — Telephone Encounter (Signed)
This encounter was created in error - please disregard.

## 2018-04-23 NOTE — Telephone Encounter (Signed)
Called Pharmacy.  Made then aware patient has scheduled an appointment.  Medication can be refilled.

## 2018-04-23 NOTE — Telephone Encounter (Signed)
Please call pharmacy to confirm if Metoprolol is refillabe before appointment.

## 2018-05-07 ENCOUNTER — Other Ambulatory Visit: Payer: Self-pay

## 2018-05-07 ENCOUNTER — Encounter: Payer: Self-pay | Admitting: Cardiovascular Disease

## 2018-05-07 ENCOUNTER — Telehealth (INDEPENDENT_AMBULATORY_CARE_PROVIDER_SITE_OTHER): Payer: BLUE CROSS/BLUE SHIELD | Admitting: Cardiovascular Disease

## 2018-05-07 VITALS — BP 112/73 | HR 74 | Ht 68.0 in | Wt 210.0 lb

## 2018-05-07 DIAGNOSIS — R002 Palpitations: Secondary | ICD-10-CM

## 2018-05-07 DIAGNOSIS — E785 Hyperlipidemia, unspecified: Secondary | ICD-10-CM

## 2018-05-07 NOTE — Progress Notes (Signed)
Virtual Visit via Video Note   This visit type was conducted due to national recommendations for restrictions regarding the COVID-19 Pandemic (e.g. social distancing) in an effort to limit this patient's exposure and mitigate transmission in our community.  Due to her co-morbid illnesses, this patient is at least at moderate risk for complications without adequate follow up.  This format is felt to be most appropriate for this patient at this time.  All issues noted in this document were discussed and addressed.  A limited physical exam was performed with this format.  Please refer to the patient's chart for her consent to telehealth for [facility].   Date:  05/07/2018   ID:  Alyssa Pratt, DOB [birth year], MRN [id number]  Patient Location: Home Provider Location: Office  PCP:  Rhodia Albright, NP  Cardiologist:  No primary care provider on file.  Electrophysiologist:  None   Evaluation Performed:  Follow-Up Visit  Chief Complaint: Palpitations and rare chest pain  History of Present Illness:    Alyssa Pratt is a 53 y.o. female was seen via video visit regarding regarding palpitations due to documented PACs. She had an echocardiogram done in 2012 and 2014 which showed normal LV systolic function without significant structural or valvular abnormalities. She had a Holter monitor done which showed few PACs without any other significant arrhythmia. She had sinus tachycardia with slightly elevated mean average heart rate.  She underwent a treadmill stress test in 2013 which showed no evidence of ischemia.  Her symptoms have been well controlled on small dose Toprol.   She had increased episodes of atypical chest pain last year and underwent a repeat treadmill stress test which was negative for ischemia or exercise-induced arrhythmia.  She has been doing well overall with no worsening symptoms.  The patient does not have symptoms concerning for COVID-19 infection (fever, chills, cough,  or new shortness of breath).    Past Medical History:  Diagnosis Date  . Anxiety   . Depression   . Elevated blood pressure    hx  . GERD (gastroesophageal reflux disease)   . HYPERLIPIDEMIA, MILD    not on medication  . MORTON'S NEUROMA, LEFT   . Palpitations    sinus tach and PACs on holter and normal echo 02/2010 , no ischemia ETT 05/2011  . TESTOSTERONE DEFICIENCY   . Vitamin B12 deficiency 12/17/2012   Lab Results Component Value Date  VITAMINB12 250 12/17/2012     Past Surgical History:  Procedure Laterality Date  . CESAREAN SECTION  5/91, 6/93, 7/02  . TUBAL LIGATION       Current Meds  Medication Sig  . atorvastatin (LIPITOR) 10 MG tablet Take 1 tablet (10 mg total) by mouth daily.  . metoprolol succinate (TOPROL-XL) 25 MG 24 hr tablet TAKE 1 TABLET ONCE DAILY.  Marland Kitchen omeprazole (PRILOSEC) 20 MG capsule Take 20 mg by mouth daily.  Marland Kitchen PARoxetine (PAXIL) 10 MG tablet TAKE 1 TABLET ONCE DAILY.     Allergies:   Latex   Social History   Tobacco Use  . Smoking status: Never Smoker  . Smokeless tobacco: Never Used  . Tobacco comment: Married, lives with spouse. 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 Alcohol use: No    Comment: Denies alcohol use.   . Drug use: No     Family Hx: The patient's family history includes Hypertension in her brother and father.  ROS:   Please see the history of present illness.  All other systems reviewed and are negative.   Prior CV studies:   The following studies were reviewed today:  Reviewed stress test results with her from last year  Labs/Other Tests and Data Reviewed:    EKG:  No ECG reviewed.  Recent Labs: 09/21/2017: ALT 22   Recent Lipid Panel Lab Results  Component Value Date/Time   CHOL 161 09/21/2017 10:05 AM   TRIG 75 09/21/2017 10:05 AM   HDL 46 09/21/2017 10:05 AM   CHOLHDL 3.5 09/21/2017 10:05 AM   LDLCALC 100 (H) 09/21/2017 10:05 AM    Wt Readings from Last 3 Encounters:  05/07/18  210 lb (95.3 kg)  07/20/17 210 lb 6.4 oz (95.4 kg)  03/01/17 207 lb 8 oz (94.1 kg)     Objective:    Vital Signs:  BP 112/73   Pulse 74   Ht 5\' 8"  (1.727 m)   Wt 210 lb (95.3 kg)   BMI 31.93 kg/m    VITAL SIGNS:  reviewed GEN:  no acute distress EYES:  sclerae anicteric, EOMI - Extraocular Movements Intact RESPIRATORY:  normal respiratory effort, symmetric expansion CARDIOVASCULAR:  no peripheral edema SKIN:  no rash, lesions or ulcers. MUSCULOSKELETAL:  no obvious deformities. NEURO:  alert and oriented x 3, no obvious focal deficit PSYCH:  normal affect  ASSESSMENT & PLAN:    1.  Symptomatic PACs: Symptoms are well-controlled with Toprol and Paxil for anxiety.  2. Atypical chest pain:  Negative treadmill stress test twice including 1 from last year.  3.  Mild hyperlipidemia: Currently on atorvastatin with mild myalgia.  We can consider switching to rosuvastatin if needed down the road.   COVID-19 Education: The signs and symptoms of COVID-19 were discussed with the patient and how to seek care for testing (follow up with PCP or arrange E-visit).  The importance of social distancing was discussed today.  Time:   Today, I have spent 15 minutes with the patient with telehealth technology discussing the above problems.     Medication Adjustments/Labs and Tests Ordered: Current medicines are reviewed at length with the patient today.  Concerns regarding medicines are outlined above.   Tests Ordered: No orders of the defined types were placed in this encounter.   Medication Changes: No orders of the defined types were placed in this encounter.   Disposition:  Follow up in 1 year(s)  Signed, Lorine BearsMuhammad Kamry Faraci, MD  05/07/2018 12:46 PM    [facility]

## 2018-05-07 NOTE — Patient Instructions (Signed)
Medication Instructions:  No changes If you need a refill on your cardiac medications before your next appointment, please call your pharmacy.   Lab work: None ordered If you have labs (blood work) drawn today and your tests are completely normal, you will receive your results only by: Marland Kitchen MyChart Message (if you have MyChart) OR . A paper copy in the mail If you have any lab test that is abnormal or we need to change your treatment, we will call you to review the results.  Testing/Procedures: None ordered  Follow-Up: At [facility], you and your health needs are our priority.  As part of our continuing mission to provide you with exceptional heart care, we have created designated Provider Care Teams.  These Care Teams include your primary Cardiologist (physician) and Advanced Practice Providers (APPs -  Physician Assistants and Nurse Practitioners) who all work together to provide you with the care you need, when you need it. You will need a follow up appointment in 12 months.  Please call our office 2 months in advance to schedule this appointment.  You may see Lorine Bears, MD or one of the following Advanced Practice Providers on your designated Care Team:   Nicolasa Ducking, NP Eula Listen, PA-C . Marisue Ivan, PA-C

## 2018-07-29 ENCOUNTER — Other Ambulatory Visit: Payer: Self-pay | Admitting: Cardiovascular Disease

## 2018-09-16 ENCOUNTER — Other Ambulatory Visit: Payer: Self-pay | Admitting: Cardiovascular Disease

## 2018-09-16 NOTE — Telephone Encounter (Signed)
°*  STAT* If patient is at the pharmacy, call can be transferred to refill team.   1. Which medications need to be refilled? (please list name of each medication and dose if known)    Atorvastatin 10 mg po q d   2. Which pharmacy/location (including street and city if local pharmacy) is medication to be sent to?  [facility] [address] [place]   3. Do they need a 30 day or 90 day supply?  [place]

## 2018-09-17 MED ORDER — ATORVASTATIN CALCIUM 10 MG PO TABS: 10.0000 mg | ORAL_TABLET | Freq: Every day | ORAL | 6 refills | Status: DC

## 2018-10-06 DIAGNOSIS — Z20828 Contact with and (suspected) exposure to other viral communicable diseases: Secondary | ICD-10-CM | POA: Diagnosis not present

## 2018-10-06 DIAGNOSIS — J069 Acute upper respiratory infection, unspecified: Secondary | ICD-10-CM | POA: Diagnosis not present

## 2018-10-06 DIAGNOSIS — Z7189 Other specified counseling: Secondary | ICD-10-CM | POA: Diagnosis not present

## 2018-10-06 DIAGNOSIS — R05 Cough: Secondary | ICD-10-CM | POA: Diagnosis not present

## 2018-11-06 ENCOUNTER — Other Ambulatory Visit: Payer: Self-pay

## 2018-11-07 ENCOUNTER — Encounter: Payer: Self-pay | Admitting: Obstetrics & Gynecology

## 2018-11-07 ENCOUNTER — Encounter

## 2018-11-07 ENCOUNTER — Ambulatory Visit: Payer: BC Managed Care – PPO | Admitting: Obstetrics & Gynecology

## 2018-11-07 VITALS — BP 120/68 | HR 80 | Temp 97.2°F | Resp 12 | Ht 67.25 in | Wt 204.8 lb

## 2018-11-07 DIAGNOSIS — Z0000 Encounter for general adult medical examination without abnormal findings: Secondary | ICD-10-CM | POA: Diagnosis not present

## 2018-11-07 DIAGNOSIS — N912 Amenorrhea, unspecified: Secondary | ICD-10-CM

## 2018-11-07 DIAGNOSIS — Z01419 Encounter for gynecological examination (general) (routine) without abnormal findings: Secondary | ICD-10-CM

## 2018-11-07 MED ORDER — PAROXETINE HCL 10 MG PO TABS: 10.0000 mg | ORAL_TABLET | Freq: Every day | ORAL | 4 refills | Status: DC

## 2018-11-07 NOTE — Progress Notes (Signed)
53 y.o. G3P3 Married White or Caucasian female here for annual exam.  Doing well.  Denies vaginal bleeding.  Had some back pain in August after a trip to the beach.  Hasn't had a cycle since 5/19.    Patient's last menstrual period was 06/01/2017.          Sexually active: Yes.    The current method of family planning is tubal ligation.    Exercising: No.  The patient does not participate in regular exercise at present. Smoker:  no  Health Maintenance: Pap:  04/21/16 Neg. HR HPV:neg              01/01/15 Neg  History of abnormal Pap:  no MMG:  04/27/16 BIRADS 1 negative/density b -- possibly done 2019 at [facility] -- patient wants to call to see if this was done more recently than 04/2016.   Colonoscopy:  05/19/10 Normal. F/u 10 years  BMD:   never TDaP:  Td done 11/16/09 Pneumonia vaccine(s):  n/a Shingrix:   Never.  Declines today. Hep C testing: n/a Screening Labs: discuss today   reports that she has never smoked. She has never used smokeless tobacco. She reports that she does not drink alcohol or use drugs.  Past Medical History:  Diagnosis Date  . Anxiety   . Depression   . Elevated blood pressure    hx  . GERD (gastroesophageal reflux disease)   . HYPERLIPIDEMIA, MILD    not on medication  . MORTON'S NEUROMA, LEFT   . Palpitations    sinus tach and PACs on holter and normal echo 02/2010 , no ischemia ETT 05/2011  . TESTOSTERONE DEFICIENCY   . Vitamin B12 deficiency 12/17/2012   Lab Results Component Value Date  VITAMINB12 250 12/17/2012      Past Surgical History:  Procedure Laterality Date  . CESAREAN SECTION  5/91, 6/93, 7/02  . TUBAL LIGATION      Current Outpatient Medications  Medication Sig Dispense Refill  . atorvastatin (LIPITOR) 10 MG tablet Take 1 tablet (10 mg total) by mouth daily. 30 tablet 6  . metoprolol succinate (TOPROL-XL) 25 MG 24 hr tablet TAKE 1 TABLET ONCE DAILY. 90 tablet 2  . omeprazole (PRILOSEC) 20 MG capsule Take 20 mg by mouth  daily.    Marland Kitchen PARoxetine (PAXIL) 10 MG tablet TAKE 1 TABLET ONCE DAILY. 90 tablet 3   No current facility-administered medications for this visit.     Family History  Problem Relation Age of Onset  . Hypertension Father   . Hypertension Brother     Review of Systems  Constitutional: Negative.   HENT: Negative.   Eyes: Negative.   Respiratory: Negative.   Cardiovascular: Negative.   Gastrointestinal: Negative.   Endocrine: Negative.   Genitourinary: Negative.   Musculoskeletal: Negative.   Skin: Negative.   Allergic/Immunologic: Negative.   Neurological: Negative.   Hematological: Negative.   Psychiatric/Behavioral: Negative.     Exam:   BP 120/68 (BP Location: Right Arm, Patient Position: Sitting, Cuff Size: Large)   Pulse 80   Temp (!) 97.2 F (36.2 C) (Temporal)   Resp 12   Ht 5' 7.25" (1.708 m)   Wt 204 lb 12.8 oz (92.9 kg)   LMP 06/01/2017   BMI 31.84 kg/m      Height: 5' 7.25" (170.8 cm)  Ht Readings from Last 3 Encounters:  11/07/18 5' 7.25" (1.708 m)  05/07/18 5\' 8"  (1.727 m)  07/20/17 5' 7.5" (1.715 m)   General  appearance: alert, cooperative and appears stated age Head: Normocephalic, without obvious abnormality, atraumatic Neck: no adenopathy, supple, symmetrical, trachea midline and thyroid normal to inspection and palpation Lungs: clear to auscultation bilaterally Breasts: normal appearance, no masses or tenderness Heart: regular rate and rhythm Abdomen: soft, non-tender; bowel sounds normal; no masses,  no organomegaly Extremities: extremities normal, atraumatic, no cyanosis or edema Skin: Skin color, texture, turgor normal. No rashes or lesions Lymph nodes: Cervical, supraclavicular, and axillary nodes normal. No abnormal inguinal nodes palpated Neurologic: Grossly normal   Pelvic: External genitalia:  no lesions              Urethra:  normal appearing urethra with no masses, tenderness or lesions              Bartholins and Skenes: normal                  Vagina: normal appearing vagina with normal color and discharge, no lesions              Cervix: no lesions              Pap taken: No. Bimanual Exam:  Uterus:  normal size, contour, position, consistency, mobility, non-tender              Adnexa: normal adnexa and no mass, fullness, tenderness               Rectovaginal: Confirms               Anus:  normal sphincter tone, no lesions  Chaperone was present for exam.  A:  Well Woman with normal exam Amenorrhea in the year Vaginal dryness H/o palpitations, followed by cardiology  P:   Mammogram guidelines reviewed Colonoscopy due in 2022 Tdap due next year pap smear with neg HR HPV 2018, not indicated today CBC, CMP, TSH and Vit, lipids and FSH obtained today Consider BMD around age 59 Shingrix vaccination discussed.  Declines today. RF for Paxil 10mg  dialy.  #90/4RF Return annually or prn

## 2018-11-08 ENCOUNTER — Other Ambulatory Visit: Payer: Self-pay | Admitting: Obstetrics & Gynecology

## 2018-11-08 ENCOUNTER — Encounter: Payer: Self-pay | Admitting: Obstetrics & Gynecology

## 2018-11-08 ENCOUNTER — Telehealth: Payer: Self-pay | Admitting: Obstetrics & Gynecology

## 2018-11-08 DIAGNOSIS — Z1231 Encounter for screening mammogram for malignant neoplasm of breast: Secondary | ICD-10-CM

## 2018-11-08 LAB — LIPID PANEL
Chol/HDL Ratio: 2.9 ratio (ref 0.0–4.4)
Cholesterol, Total: 146 mg/dL (ref 100–199)
HDL: 51 mg/dL (ref >39)
LDL Chol Calc (NIH): 76 mg/dL (ref 0–99)
Triglycerides: 106 mg/dL (ref 0–149)
VLDL Cholesterol Cal: 19 mg/dL (ref 5–40)

## 2018-11-08 LAB — COMPREHENSIVE METABOLIC PANEL
ALT: 16 IU/L (ref 0–32)
AST: 15 IU/L (ref 0–40)
Albumin/Globulin Ratio: 2 (ref 1.2–2.2)
Albumin: 4.5 g/dL (ref 3.8–4.9)
Alkaline Phosphatase: 112 IU/L (ref 39–117)
BUN/Creatinine Ratio: 20 (ref 9–23)
BUN: 18 mg/dL (ref 6–24)
Bilirubin Total: 0.3 mg/dL (ref 0.0–1.2)
CO2: 24 mmol/L (ref 20–29)
Calcium: 9.7 mg/dL (ref 8.7–10.2)
Chloride: 103 mmol/L (ref 96–106)
Creatinine, Ser: 0.9 mg/dL (ref 0.57–1.00)
GFR calc Af Amer: 84 mL/min/{1.73_m2} (ref >59)
GFR calc non Af Amer: 73 mL/min/{1.73_m2} (ref >59)
Globulin, Total: 2.3 g/dL (ref 1.5–4.5)
Glucose: 77 mg/dL (ref 65–99)
Potassium: 4.1 mmol/L (ref 3.5–5.2)
Sodium: 140 mmol/L (ref 134–144)
Total Protein: 6.8 g/dL (ref 6.0–8.5)

## 2018-11-08 LAB — CBC
Hematocrit: 40.1 % (ref 34.0–46.6)
Hemoglobin: 13.4 g/dL (ref 11.1–15.9)
MCH: 31 pg (ref 26.6–33.0)
MCHC: 33.4 g/dL (ref 31.5–35.7)
MCV: 93 fL (ref 79–97)
Platelets: 246 10*3/uL (ref 150–450)
RBC: 4.32 x10E6/uL (ref 3.77–5.28)
RDW: 13.2 % (ref 11.7–15.4)
WBC: 7.7 10*3/uL (ref 3.4–10.8)

## 2018-11-08 LAB — VITAMIN D 25 HYDROXY (VIT D DEFICIENCY, FRACTURES): Vit D, 25-Hydroxy: 31 ng/mL (ref 30.0–100.0)

## 2018-11-08 LAB — TSH: TSH: 2.9 u[IU]/mL (ref 0.450–4.500)

## 2018-11-08 LAB — FOLLICLE STIMULATING HORMONE: FSH: 49.5 m[IU]/mL

## 2018-11-08 NOTE — Telephone Encounter (Signed)
Spoke with pt. MMG records received from [facility] and placed in Dr Ammie Ferrier box. Last  MMG visit 08/06/2017. AEX 11/07/18. MMG scheduled at [facility] 01/01/19 at 8:40am by Ena Dawley. Pt verbalized understanding of appt.  Routing to provider for final review. Patient is agreeable to disposition. Will close encounter.

## 2018-11-08 NOTE — Telephone Encounter (Signed)
Patient sent the following correspondence through [facility].  Good morning!  I contacted [facility] about my last mammogram and it was August 07, 2017 and I'm not sure why they never sent your office the report - I asked them to please send the record of that mammogram to your office and they said that your office would have to request that from medical records (???? really????)   Do you want me to have another mammogram in 2020?  If so, you mentioned that your office could have this scheduled for me.  If so, I'd take any appointment early in the morning before I go into work on either a Monday, Wednesday and Friday.  It was great to see you yesterday.  Thanks for the care you give to me.  Appreciate you very much.  Blessings - Alyssa Pratt  Cc: Alvis Lemmings

## 2019-01-01 ENCOUNTER — Ambulatory Visit: Payer: BLUE CROSS/BLUE SHIELD

## 2019-02-07 ENCOUNTER — Ambulatory Visit: Payer: Self-pay

## 2019-02-12 ENCOUNTER — Telehealth: Payer: Self-pay | Admitting: Obstetrics & Gynecology

## 2019-02-12 ENCOUNTER — Encounter: Payer: Self-pay | Admitting: Obstetrics & Gynecology

## 2019-02-12 NOTE — Telephone Encounter (Signed)
Patient returned call

## 2019-02-12 NOTE — Telephone Encounter (Signed)
Patient sent the following message through MyChart. Routing to triage to assist patient with request.  [place] [place] [facility]  Phone Number: [phone]  Good morning! I wanted to see if you had any suggestions for my situation - I have been taking Paxil for several years now and am having issues with low (not there actually) sex drive/libido ... I truly feel that Paxil is the culprit - are there any other anti-depressants (SSRI's) that I could take or medication changes/additions that would help with this? I know I cannot just stop taking Paxil or lower the dose either, so any assistance in this would be helpful to me in my marriage (35 years)   Thank you Dr. Hyacinth Meeker

## 2019-02-12 NOTE — Telephone Encounter (Signed)
Spoke with patient. Denies any other GYN symptoms, MyChart visit scheduled for 2/23 at 4:30pm. Patient declines earlier appt offered.   Routing to provider for final review. Patient is agreeable to disposition. Will close encounter.

## 2019-02-12 NOTE — Telephone Encounter (Signed)
Left message to call Noreene Larsson, RN at [facility] [phone].   Last AEX 11/07/18

## 2019-02-17 ENCOUNTER — Ambulatory Visit
Admission: RE | Admit: 2019-02-17 | Discharge: 2019-02-17 | Disposition: A | Discharge status: Home / Self Care | Payer: BC Managed Care – PPO | Source: Ambulatory Visit | Attending: Obstetrics & Gynecology | Admitting: Obstetrics & Gynecology

## 2019-02-17 ENCOUNTER — Other Ambulatory Visit: Payer: Self-pay

## 2019-02-17 DIAGNOSIS — Z1231 Encounter for screening mammogram for malignant neoplasm of breast: Secondary | ICD-10-CM

## 2019-02-25 ENCOUNTER — Telehealth (INDEPENDENT_AMBULATORY_CARE_PROVIDER_SITE_OTHER): Payer: BC Managed Care – PPO | Admitting: Obstetrics & Gynecology

## 2019-02-25 DIAGNOSIS — R6882 Decreased libido: Secondary | ICD-10-CM

## 2019-02-25 NOTE — Progress Notes (Signed)
Virtual Visit via Video Note  I connected with Alyssa Pratt on 02/25/19 at  4:30 PM EST by a video enabled telemedicine application and verified that I am speaking with the correct person using two identifiers.  Location: Patient: home Provider: office   I discussed the limitations of evaluation and management by telemedicine and the availability of in person appointments. The patient expressed understanding and agreed to proceed.  History of Present Illness: 54 yo G3P3 who has concerns about decreased libido.  This has been an ongoing issue for her.  In 2016, we discussed similar concerns and she used topical testosterone with some success.  However, this was stopped due to topical itching.  Since that time, she is now feeling more menopausal with hot flashes and night sweats.  She doe not want to be on estrogen based HRT.  She has been on Paxil for year.  Anxious about stopping this or even just taking 1/2 tab daily.  We did discuss this regimen but she decided against this today.   Observations/Objective: WNWD WF, NAD  Assessment and Plan: Decreased libido  Follow Up Instructions: She is going to come to the office for a total testosterone level to compare to 2016 level.  Future order placed. If lower, will consider another testosterone preparation for supplementation.  Pt is in agreement,    I discussed the assessment and treatment plan with the patient. The patient was provided an opportunity to ask questions and all were answered. The patient agreed with the plan and demonstrated an understanding of the instructions.   The patient was advised to call back or seek an in-person evaluation if the symptoms worsen or if the condition fails to improve as anticipated.  I provided 20 minutes of non-face-to-face time during this encounter.   Jerene Bears, MD

## 2019-02-27 ENCOUNTER — Encounter: Payer: Self-pay | Admitting: Obstetrics & Gynecology

## 2019-02-28 ENCOUNTER — Other Ambulatory Visit (INDEPENDENT_AMBULATORY_CARE_PROVIDER_SITE_OTHER): Payer: BC Managed Care – PPO

## 2019-02-28 ENCOUNTER — Other Ambulatory Visit: Payer: Self-pay

## 2019-02-28 DIAGNOSIS — R6882 Decreased libido: Secondary | ICD-10-CM | POA: Diagnosis not present

## 2019-03-06 ENCOUNTER — Encounter: Payer: Self-pay | Admitting: Obstetrics & Gynecology

## 2019-03-06 LAB — TESTOSTERONE, TOTAL, LC/MS/MS: Testosterone, total: 15 ng/dL

## 2019-03-07 ENCOUNTER — Other Ambulatory Visit: Payer: Self-pay | Admitting: Obstetrics & Gynecology

## 2019-03-07 ENCOUNTER — Encounter: Payer: Self-pay | Admitting: Obstetrics & Gynecology

## 2019-03-07 MED ORDER — NONFORMULARY OR COMPOUNDED ITEM: 1 refills | Status: DC

## 2019-03-07 NOTE — Progress Notes (Signed)
[facility] for [facility]

## 2019-04-21 ENCOUNTER — Other Ambulatory Visit: Payer: Self-pay | Admitting: Cardiovascular Disease

## 2019-05-09 ENCOUNTER — Telehealth: Payer: Self-pay | Admitting: Cardiovascular Disease

## 2019-05-09 ENCOUNTER — Other Ambulatory Visit: Payer: Self-pay

## 2019-05-09 MED ORDER — METOPROLOL SUCCINATE ER 25 MG PO TB24: 25.0000 mg | ORAL_TABLET | Freq: Every day | ORAL | 0 refills | Status: DC

## 2019-05-09 MED ORDER — ATORVASTATIN CALCIUM 10 MG PO TABS: ORAL_TABLET | ORAL | 0 refills | Status: DC

## 2019-05-09 MED ORDER — PAROXETINE HCL 10 MG PO TABS: 10.0000 mg | ORAL_TABLET | Freq: Every day | ORAL | 0 refills | Status: DC

## 2019-05-09 NOTE — Telephone Encounter (Signed)
*  STAT* If patient is at the pharmacy, call can be transferred to refill team.   1. Which medications need to be refilled? (please list name of each medication and dose if known) lipitor 10 mg, metoprolol 25 mg, Paxil 10 mg  2. Which pharmacy/location (including street and city if local pharmacy) is medication to be sent to? [facility] in [place] on [address]  3. Do they need a 30 day or 90 day supply? 90    Patient misplaced medications and does not have any medications at all. Patient has an appointment on 5/25

## 2019-05-09 NOTE — Telephone Encounter (Signed)
Medications Sent

## 2019-05-27 ENCOUNTER — Encounter: Payer: Self-pay | Admitting: Cardiovascular Disease

## 2019-05-27 ENCOUNTER — Ambulatory Visit: Payer: BC Managed Care – PPO | Admitting: Cardiovascular Disease

## 2019-05-27 ENCOUNTER — Other Ambulatory Visit: Payer: Self-pay

## 2019-05-27 VITALS — BP 120/74 | HR 86 | Ht 67.0 in | Wt 211.0 lb

## 2019-05-27 DIAGNOSIS — E785 Hyperlipidemia, unspecified: Secondary | ICD-10-CM

## 2019-05-27 DIAGNOSIS — R002 Palpitations: Secondary | ICD-10-CM

## 2019-05-27 NOTE — Patient Instructions (Signed)
Medication Instructions:  Your physician recommends that you continue on your current medications as directed. Please refer to the Current Medication list given to you today.  *If you need a refill on your cardiac medications before your next appointment, please call your pharmacy*   Lab Work: None ordered If you have labs (blood work) drawn today and your tests are completely normal, you will receive your results only by: . MyChart Message (if you have MyChart) OR . A paper copy in the mail If you have any lab test that is abnormal or we need to change your treatment, we will call you to review the results.   Testing/Procedures: None ordered   Follow-Up: At [facility], you and your health needs are our priority.  As part of our continuing mission to provide you with exceptional heart care, we have created designated Provider Care Teams.  These Care Teams include your primary Cardiologist (physician) and Advanced Practice Providers (APPs -  Physician Assistants and Nurse Practitioners) who all work together to provide you with the care you need, when you need it.  We recommend signing up for the patient portal called "MyChart".  Sign up information is provided on this After Visit Summary.  MyChart is used to connect with patients for Virtual Visits (Telemedicine).  Patients are able to view lab/test results, encounter notes, upcoming appointments, etc.  Non-urgent messages can be sent to your provider as well.   To learn more about what you can do with MyChart, go to [email or web address].    Your next appointment:   12 month(s)  The format for your next appointment:   In Person  Provider:    You may see Dr. Arida or one of the following Advanced Practice Providers on your designated Care Team:    Christopher Berge, NP  Ryan Dunn, PA-C  Jacquelyn Visser, PA-C    Other Instructions N/A  

## 2019-05-27 NOTE — Progress Notes (Signed)
Cardiology Office Note   Date:  05/27/2019   ID:  Alyssa Pratt, DOB [birth year], MRN [id number]  PCP:  Laurel Dimmer, FNP  Cardiologist:   Lorine Bears, MD   Chief Complaint  Patient presents with  . Other    12 month follow up. patient c.o sharpe chest at random. Meds reviewed verbally with patient.       History of Present Illness: Alyssa Pratt is a 54 y.o. female who presents for a followup visit regarding palpitations due to documented PACs. She had an echocardiogram done in 2012 and 2014 which showed normal LV systolic function without significant structural or valvular abnormalities. She had a Holter monitor done which showed few PACs without any other significant arrhythmia. She had sinus tachycardia with slightly elevated mean average heart rate. Her symptoms have been well controlled on small dose Toprol.    She has known history of atypical chest pain.  Most recent treadmill stress test in March 2019 was normal. She does have known history of hyperlipidemia.  Previous CT calcium score in 2019 was mildly abnormal at 7.  Based on that, we elected to treat her with low dose of atorvastatin.  She has been doing well with no significant palpitations.  She continues to have occasional chest discomfort lasting for few seconds.  No exertional symptoms.  She had COVID-19 infection last year but her symptoms were overall mild.     Past Medical History:  Diagnosis Date  . Anxiety   . Depression   . Elevated blood pressure    hx  . GERD (gastroesophageal reflux disease)   . HYPERLIPIDEMIA, MILD    not on medication  . MORTON'S NEUROMA, LEFT   . Palpitations    sinus tach and PACs on holter and normal echo 02/2010 , no ischemia ETT 05/2011  . TESTOSTERONE DEFICIENCY   . Vitamin B12 deficiency 12/17/2012   Lab Results Component Value Date  VITAMINB12 250 12/17/2012      Past Surgical History:  Procedure Laterality Date  . CESAREAN SECTION  5/91,  6/93, 7/02  . TUBAL LIGATION       Current Outpatient Medications  Medication Sig Dispense Refill  . atorvastatin (LIPITOR) 10 MG tablet TAKE 1 TABLET(10 MG) BY MOUTH DAILY 90 tablet 0  . metoprolol succinate (TOPROL-XL) 25 MG 24 hr tablet Take 1 tablet (25 mg total) by mouth daily. 90 tablet 0  . NONFORMULARY OR COMPOUNDED ITEM Topical testosterone base 2% in HRT.  Apply 1/4 tsp to inner thighs two to three times weekly.  Disp 60 gms 1 each 1  . omeprazole (PRILOSEC) 20 MG capsule Take 20 mg by mouth daily.    Marland Kitchen PARoxetine (PAXIL) 10 MG tablet Take 1 tablet (10 mg total) by mouth daily. 90 tablet 0   No current facility-administered medications for this visit.    Allergies:   Latex    Social History:  The patient  reports that she has never smoked. She has never used smokeless tobacco. She reports that she does not drink alcohol or use drugs.   Family History:  The patient's family history includes Hypertension in her brother and father.    ROS:  Please see the history of present illness.   Otherwise, review of systems are positive for none.   All other systems are reviewed and negative.    PHYSICAL EXAM: VS:  BP 120/74 (BP Location: Left Arm, Patient Position: Sitting, Cuff Size: Normal)   Pulse 86  Ht 5\' 7"  (1.702 m)   Wt 211 lb (95.7 kg)   SpO2 98%   BMI 33.05 kg/m  , BMI Body mass index is 33.05 kg/m. GEN: Well nourished, well developed, in no acute distress  HEENT: normal  Neck: no JVD, carotid bruits, or masses Cardiac: RRR; no murmurs, rubs, or gallops,no edema  Respiratory:  clear to auscultation bilaterally, normal work of breathing GI: soft, nontender, nondistended, + BS MS: no deformity or atrophy  Skin: warm and dry, no rash Neuro:  Strength and sensation are intact Psych: euthymic mood, full affect   EKG:  EKG is ordered today. The ekg ordered today demonstrates normal sinus rhythm with no significant ST or T wave changes.  Recent Labs: 11/07/2018:  ALT 16; BUN 18; Creatinine, Ser 0.90; Hemoglobin 13.4; Platelets 246; Potassium 4.1; Sodium 140; TSH 2.900    Lipid Panel    Component Value Date/Time   CHOL 146 11/07/2018 0957   TRIG 106 11/07/2018 0957   HDL 51 11/07/2018 0957   CHOLHDL 2.9 11/07/2018 0957   CHOLHDL 3.5 09/21/2017 1005   VLDL 15 09/21/2017 1005   LDLCALC 76 11/07/2018 0957      Wt Readings from Last 3 Encounters:  05/27/19 211 lb (95.7 kg)  11/07/18 204 lb 12.8 oz (92.9 kg)  05/07/18 210 lb (95.3 kg)        ASSESSMENT AND PLAN:  1.  Symptomatic PACs: Symptoms are well-controlled with Toprol and Paxil for anxiety.  2. Atypical chest pain:  Seems to be musculoskeletal.  Negative treadmill stress test in the past was most recently in 2019.  3.  Mild hyperlipidemia: She is no longer having myalgia with atorvastatin 10 mg daily.  Most recent lipid profile showed an LDL of 76 and triglyceride of 106.  4.  Patient had questions about COVID-19 vaccine and I addressed this with her today and recommended that she considers taking the vaccine.    Disposition:   FU with me in 1 year  Signed,  Kathlyn Sacramento, MD  05/27/2019 4:10 PM    Alyssa Pratt

## 2019-08-05 ENCOUNTER — Other Ambulatory Visit: Payer: Self-pay | Admitting: Cardiovascular Disease

## 2019-08-19 ENCOUNTER — Telehealth: Payer: Self-pay

## 2019-08-19 DIAGNOSIS — R6882 Decreased libido: Secondary | ICD-10-CM

## 2019-08-19 NOTE — Telephone Encounter (Signed)
Patient is calling in regards to wanting her testosterone levels checked.

## 2019-08-19 NOTE — Telephone Encounter (Signed)
Spoke with patient. Patient is requesting to schedule repeat testosterone labs.  Was seen by MyChart visit on 02/25/19 for decreased libido, started on testosterone cream, advised to f/u in 3 months for repeat labs.   Patient reports she is on her last week of current Rx with one refill. Medication is helping, thinks she may need to increase frequency.   Lab appt scheduled for 8/18 at 9:15am for repeat testosterone.   Advised patient I will update Dr. Hyacinth Meeker and return call if any additional recommendations. Patient agreeable.   Last AEX 11/07/18 Next AEX 02/05/20  Order pended.   Routing to Dr. Hyacinth Meeker.

## 2019-08-19 NOTE — Telephone Encounter (Signed)
Left message to call Zerick Prevette, RN at [facility] [phone].   

## 2019-08-20 ENCOUNTER — Other Ambulatory Visit (INDEPENDENT_AMBULATORY_CARE_PROVIDER_SITE_OTHER): Payer: BC Managed Care – PPO

## 2019-08-20 ENCOUNTER — Other Ambulatory Visit: Payer: Self-pay

## 2019-08-20 DIAGNOSIS — R6882 Decreased libido: Secondary | ICD-10-CM

## 2019-08-20 NOTE — Telephone Encounter (Signed)
Order signed.  Ok to close encounter.  Thanks.

## 2019-08-23 LAB — TESTOSTERONE, TOTAL, LC/MS/MS: Testosterone, total: 99.1 ng/dL

## 2019-08-24 ENCOUNTER — Encounter: Payer: Self-pay | Admitting: Obstetrics & Gynecology

## 2019-08-25 ENCOUNTER — Telehealth: Payer: Self-pay | Admitting: Obstetrics & Gynecology

## 2019-08-25 DIAGNOSIS — R6882 Decreased libido: Secondary | ICD-10-CM

## 2019-08-25 NOTE — Telephone Encounter (Signed)
Dr. Hyacinth Meeker -please review 08/21/19 testosterone level and advise.

## 2019-08-25 NOTE — Telephone Encounter (Signed)
Alyssa Pratt  P [facility] Saw where my testosterone level is .. just wondering how I should adjust my prescription ... It's time for me to renew my prescription so just let me know what I should do. Thank you Dr. Hyacinth Meeker I appreciate you.

## 2019-08-26 NOTE — Telephone Encounter (Signed)
Please let pt know her testosterone level is 99.  This is higher than I would like it.  She needs to decrease the use down by one time weekly.  So, if using three times weekly, she should go to 2 times.  If using 2 times, should go to once.  Should repeat total testosterone level in 3 months.  Thanks.

## 2019-08-26 NOTE — Telephone Encounter (Signed)
Left message to call Mister Krahenbuhl, RN at [facility] [phone].   

## 2019-08-26 NOTE — Telephone Encounter (Signed)
Spoke with patient, advised per Dr. Hyacinth Meeker.  Patient reports she was using testosterone cream 3 times a week, will reduce to two times a week. She has one refill at the pharmacy, does not need Rx at this time.  Lab appt scheduled for 11/24/19 at 8:45am. Lab order placed.  Patient verbalizes understanding and is agreeable.   Encounter closed.

## 2019-11-10 ENCOUNTER — Telehealth: Payer: Self-pay | Admitting: Cardiovascular Disease

## 2019-11-10 ENCOUNTER — Other Ambulatory Visit: Payer: Self-pay

## 2019-11-10 MED ORDER — PAROXETINE HCL 10 MG PO TABS: 10.0000 mg | ORAL_TABLET | Freq: Every day | ORAL | 0 refills | Status: DC

## 2019-11-10 MED ORDER — ATORVASTATIN CALCIUM 10 MG PO TABS: 10.0000 mg | ORAL_TABLET | Freq: Every day | ORAL | 0 refills | Status: DC

## 2019-11-10 MED ORDER — METOPROLOL SUCCINATE ER 25 MG PO TB24: ORAL_TABLET | ORAL | 0 refills | Status: DC

## 2019-11-10 NOTE — Telephone Encounter (Signed)
*  STAT* If patient is at the pharmacy, call can be transferred to refill team.   1. Which medications need to be refilled? (please list name of each medication and dose if known) Lipitor, metoprolol and paxil  2. Which pharmacy/location (including street and city if local pharmacy) is medication to be sent to?[facility] in [place]  3. Do they need a 30 day or 90 day supply? 90

## 2019-11-10 NOTE — Telephone Encounter (Signed)
Requested Prescriptions   Signed Prescriptions Disp Refills  . atorvastatin (LIPITOR) 10 MG tablet 90 tablet 0    Sig: Take 1 tablet (10 mg total) by mouth daily.    Authorizing Provider: ARIDA, MUHAMMAD A    Ordering User: Amirrah Quigley N  . metoprolol succinate (TOPROL-XL) 25 MG 24 hr tablet 90 tablet 0    Sig: TAKE 1 TABLET(25 MG) BY MOUTH DAILY    Authorizing Provider: ARIDA, MUHAMMAD A    Ordering User: Kaikoa Magro N  . PARoxetine (PAXIL) 10 MG tablet 90 tablet 0    Sig: Take 1 tablet (10 mg total) by mouth daily.    Authorizing Provider: ARIDA, MUHAMMAD A    Ordering User: Reason Helzer N    

## 2019-11-10 NOTE — Telephone Encounter (Signed)
Requested Prescriptions   Signed Prescriptions Disp Refills  . atorvastatin (LIPITOR) 10 MG tablet 90 tablet 0    Sig: Take 1 tablet (10 mg total) by mouth daily.    Authorizing Provider: Lorine Bears A    Ordering User: Margrett Rud metoprolol succinate (TOPROL-XL) 25 MG 24 hr tablet 90 tablet 0    Sig: TAKE 1 TABLET(25 MG) BY MOUTH DAILY    Authorizing Provider: Lorine Bears A    Ordering User: Margrett Rud PARoxetine (PAXIL) 10 MG tablet 90 tablet 0    Sig: Take 1 tablet (10 mg total) by mouth daily.    Authorizing Provider: Lorine Bears A    Ordering User: Margrett Rud

## 2019-11-24 ENCOUNTER — Other Ambulatory Visit: Payer: Self-pay

## 2019-11-24 ENCOUNTER — Other Ambulatory Visit (INDEPENDENT_AMBULATORY_CARE_PROVIDER_SITE_OTHER): Payer: BC Managed Care – PPO

## 2019-11-24 DIAGNOSIS — R6882 Decreased libido: Secondary | ICD-10-CM | POA: Diagnosis not present

## 2019-11-28 LAB — TESTOSTERONE, TOTAL, LC/MS/MS: Testosterone, total: 43.6 ng/dL

## 2019-12-04 ENCOUNTER — Other Ambulatory Visit: Payer: Self-pay | Admitting: Obstetrics & Gynecology

## 2019-12-04 MED ORDER — NONFORMULARY OR COMPOUNDED ITEM: 1 refills | Status: DC

## 2020-01-09 ENCOUNTER — Other Ambulatory Visit: Payer: Self-pay | Admitting: Obstetrics & Gynecology

## 2020-01-09 DIAGNOSIS — Z1231 Encounter for screening mammogram for malignant neoplasm of breast: Secondary | ICD-10-CM

## 2020-02-05 ENCOUNTER — Ambulatory Visit: Payer: BC Managed Care – PPO

## 2020-02-05 DIAGNOSIS — D485 Neoplasm of uncertain behavior of skin: Secondary | ICD-10-CM | POA: Diagnosis not present

## 2020-02-06 ENCOUNTER — Other Ambulatory Visit: Payer: Self-pay | Admitting: Cardiovascular Disease

## 2020-02-06 ENCOUNTER — Telehealth: Payer: Self-pay | Admitting: Cardiovascular Disease

## 2020-02-06 MED ORDER — METOPROLOL SUCCINATE ER 25 MG PO TB24: 25.0000 mg | ORAL_TABLET | Freq: Every day | ORAL | 1 refills | Status: DC

## 2020-02-06 MED ORDER — ATORVASTATIN CALCIUM 10 MG PO TABS: 10.0000 mg | ORAL_TABLET | Freq: Every day | ORAL | 1 refills | Status: DC

## 2020-02-06 MED ORDER — OMEPRAZOLE 20 MG PO CPDR: 20.0000 mg | DELAYED_RELEASE_CAPSULE | Freq: Every day | ORAL | 1 refills | Status: DC

## 2020-02-06 MED ORDER — PAROXETINE HCL 10 MG PO TABS: 10.0000 mg | ORAL_TABLET | Freq: Every day | ORAL | 1 refills | Status: DC

## 2020-02-06 NOTE — Telephone Encounter (Signed)
*  STAT* If patient is at the pharmacy, call can be transferred to refill team.   1. Which medications need to be refilled? (please list name of each medication and dose if known)   Atorvastatin 10 mg po q d  Metoprolol 25 mg po q d  Omeprazole 20 mg po q d  Paroxetine 10 mg po q d    2. Which pharmacy/location (including street and city if local pharmacy) is medication to be sent to? [facility] [place]   3. Do they need a 30 day or 90 day supply? 90

## 2020-02-06 NOTE — Telephone Encounter (Signed)
Rx request sent to pharmacy.  

## 2020-02-19 ENCOUNTER — Ambulatory Visit: Payer: BC Managed Care – PPO

## 2020-03-11 ENCOUNTER — Ambulatory Visit: Payer: BC Managed Care – PPO

## 2020-04-29 ENCOUNTER — Ambulatory Visit: Payer: Self-pay

## 2020-05-08 ENCOUNTER — Other Ambulatory Visit: Payer: Self-pay | Admitting: Cardiovascular Disease

## 2020-05-10 NOTE — Telephone Encounter (Signed)
Rx request sent to pharmacy.  

## 2020-05-13 ENCOUNTER — Ambulatory Visit
Admission: RE | Admit: 2020-05-13 | Discharge: 2020-05-13 | Disposition: A | Discharge status: Home / Self Care | Payer: BC Managed Care – PPO | Source: Ambulatory Visit | Attending: Obstetrics & Gynecology | Admitting: Obstetrics & Gynecology

## 2020-05-13 ENCOUNTER — Other Ambulatory Visit: Payer: Self-pay

## 2020-05-13 DIAGNOSIS — Z1231 Encounter for screening mammogram for malignant neoplasm of breast: Secondary | ICD-10-CM

## 2020-05-27 ENCOUNTER — Encounter: Payer: Self-pay | Admitting: Cardiovascular Disease

## 2020-05-27 ENCOUNTER — Ambulatory Visit (INDEPENDENT_AMBULATORY_CARE_PROVIDER_SITE_OTHER): Payer: BC Managed Care – PPO | Admitting: Cardiovascular Disease

## 2020-05-27 ENCOUNTER — Other Ambulatory Visit: Payer: Self-pay

## 2020-05-27 VITALS — BP 110/70 | HR 70 | Ht 68.0 in | Wt 216.4 lb

## 2020-05-27 DIAGNOSIS — I491 Atrial premature depolarization: Secondary | ICD-10-CM | POA: Diagnosis not present

## 2020-05-27 DIAGNOSIS — E785 Hyperlipidemia, unspecified: Secondary | ICD-10-CM

## 2020-05-27 MED ORDER — ATORVASTATIN CALCIUM 10 MG PO TABS: ORAL_TABLET | ORAL | 2 refills | Status: DC

## 2020-05-27 MED ORDER — OMEPRAZOLE 20 MG PO CPDR: 20.0000 mg | DELAYED_RELEASE_CAPSULE | Freq: Every day | ORAL | 2 refills | Status: AC

## 2020-05-27 MED ORDER — METOPROLOL SUCCINATE ER 25 MG PO TB24: ORAL_TABLET | ORAL | 2 refills | Status: DC

## 2020-05-27 NOTE — Progress Notes (Signed)
Cardiology Office Note   Date:  05/27/2020   ID:  Alyssa Pratt, DOB Aug 03, 1965, MRN [id number]  PCP:  Laurel Dimmer, FNP  Cardiologist:   Lorine Bears, MD   Chief Complaint  Patient presents with  . Other    12 month f/u no complaints today. Meds reviewed verbally with pt.      History of Present Illness: Alyssa Pratt is a 55 y.o. female who presents for a followup visit regarding palpitations due to documented PACs. She had an echocardiogram done in 2012 and 2014 which showed normal LV systolic function without significant structural or valvular abnormalities. She had a Holter monitor done which showed few PACs without any other significant arrhythmia. She had sinus tachycardia with slightly elevated mean average heart rate. Her symptoms have been well controlled on small dose Toprol.    She has known history of chronic atypical chest pain.  Most recent treadmill stress test in March 2019 was normal. She does have known history of hyperlipidemia.  Previous CT calcium score in 2019 was mildly abnormal at 7.  Based on that, we elected to treat her with low dose of atorvastatin.  She has been doing well with no shortness of breath or palpitations.  She has rare episodes of sharp chest pain lasting for few seconds.  This is unchanged from before.    Past Medical History:  Diagnosis Date  . Anxiety   . Depression   . Elevated blood pressure    hx  . GERD (gastroesophageal reflux disease)   . HYPERLIPIDEMIA, MILD    not on medication  . MORTON'S NEUROMA, LEFT   . Palpitations    sinus tach and PACs on holter and normal echo 02/2010 , no ischemia ETT 05/2011  . TESTOSTERONE DEFICIENCY   . Vitamin B12 deficiency 12/17/2012   Lab Results Component Value Date  VITAMINB12 250 12/17/2012      Past Surgical History:  Procedure Laterality Date  . CESAREAN SECTION  5/91, 6/93, 7/02  . TUBAL LIGATION       Current Outpatient Medications  Medication Sig  Dispense Refill  . atorvastatin (LIPITOR) 10 MG tablet TAKE 1 TABLET(10 MG) BY MOUTH DAILY 90 tablet 0  . metoprolol succinate (TOPROL-XL) 25 MG 24 hr tablet TAKE 1 TABLET(25 MG) BY MOUTH DAILY 90 tablet 0  . NONFORMULARY OR COMPOUNDED ITEM Topical testosterone base 2% in HRT.  Apply 1/4 tsp to inner thighs two to three times weekly.  Disp 60 gms 1 each 1  . omeprazole (PRILOSEC) 20 MG capsule Take 1 capsule (20 mg total) by mouth daily. 90 capsule 1  . PARoxetine (PAXIL) 10 MG tablet Take 1 tablet (10 mg total) by mouth daily. 90 tablet 1   No current facility-administered medications for this visit.    Allergies:   Latex    Social History:  The patient  reports that she has never smoked. She has never used smokeless tobacco. She reports that she does not drink alcohol and does not use drugs.   Family History:  The patient's family history includes Hypertension in her brother and father.    ROS:  Please see the history of present illness.   Otherwise, review of systems are positive for none.   All other systems are reviewed and negative.    PHYSICAL EXAM: VS:  BP 110/70 (BP Location: Right Arm, Patient Position: Sitting, Cuff Size: Normal)   Pulse 70   Ht 5\' 8"  (1.727 m)  Wt 216 lb 6 oz (98.1 kg)   SpO2 98%   BMI 32.90 kg/m  , BMI Body mass index is 32.9 kg/m. GEN: Well nourished, well developed, in no acute distress  HEENT: normal  Neck: no JVD, carotid bruits, or masses Cardiac: RRR; no murmurs, rubs, or gallops,no edema  Respiratory:  clear to auscultation bilaterally, normal work of breathing GI: soft, nontender, nondistended, + BS MS: no deformity or atrophy  Skin: warm and dry, no rash Neuro:  Strength and sensation are intact Psych: euthymic mood, full affect   EKG:  EKG is ordered today. The ekg ordered today demonstrates normal sinus rhythm with no significant ST or T wave changes.  Recent Labs: No results found for requested labs within last 8760 hours.     Lipid Panel    Component Value Date/Time   CHOL 146 11/07/2018 0957   TRIG 106 11/07/2018 0957   HDL 51 11/07/2018 0957   CHOLHDL 2.9 11/07/2018 0957   CHOLHDL 3.5 09/21/2017 1005   VLDL 15 09/21/2017 1005   LDLCALC 76 11/07/2018 0957      Wt Readings from Last 3 Encounters:  05/27/20 216 lb 6 oz (98.1 kg)  05/27/19 211 lb (95.7 kg)  11/07/18 204 lb 12.8 oz (92.9 kg)        ASSESSMENT AND PLAN:  1.  Symptomatic PACs: Symptoms are well-controlled with Toprol and Paxil for anxiety.  2. Atypical chest pain:  Seems to be musculoskeletal.  Negative treadmill stress test in the past was most recently in 2019.  Coronary calcium score was low in 2019.  3.  Mild hyperlipidemia: She is tolerating atorvastatin 10 mg daily.  I requested lipid and liver profile and also routine annual labs.    Disposition:   FU with me in 1 year  Signed,  Lorine Bears, MD  05/27/2020 8:38 AM    [facility]

## 2020-05-27 NOTE — Patient Instructions (Signed)
Medication Instructions:  Your physician recommends that you continue on your current medications as directed. Please refer to the Current Medication list given to you today.  *If you need a refill on your cardiac medications before your next appointment, please call your pharmacy*   Lab Work: Lipid, Cmp, Cbc today If you have labs (blood work) drawn today and your tests are completely normal, you will receive your results only by: Marland Kitchen MyChart Message (if you have MyChart) OR . A paper copy in the mail If you have any lab test that is abnormal or we need to change your treatment, we will call you to review the results.   Testing/Procedures: None ordered   Follow-Up: At [facility], you and your health needs are our priority.  As part of our continuing mission to provide you with exceptional heart care, we have created designated Provider Care Teams.  These Care Teams include your primary Cardiologist (physician) and Advanced Practice Providers (APPs -  Physician Assistants and Nurse Practitioners) who all work together to provide you with the care you need, when you need it.  We recommend signing up for the patient portal called "MyChart".  Sign up information is provided on this After Visit Summary.  MyChart is used to connect with patients for Virtual Visits (Telemedicine).  Patients are able to view lab/test results, encounter notes, upcoming appointments, etc.  Non-urgent messages can be sent to your provider as well.   To learn more about what you can do with MyChart, go to [email or web address].    Your next appointment:   Your physician wants you to follow-up in: 1 year onYou will receive a reminder letter in the mail two months in advance. If you don't receive a letter, please call our office to schedule the follow-up appointment.   The format for your next appointment:   In Person  Provider:   You may see Lorine Bears, MD or one of the following Advanced Practice  Providers on your designated Care Team:    Nicolasa Ducking, NP  Eula Listen, PA-C  Marisue Ivan, PA-C  Cadence [place], [place]  Gillian Shields, NP    Other Instructions N/A

## 2020-05-28 LAB — LIPID PANEL
Chol/HDL Ratio: 3.6 ratio (ref 0.0–4.4)
Cholesterol, Total: 118 mg/dL (ref 100–199)
HDL: 33 mg/dL — ABNORMAL LOW (ref >39)
LDL Chol Calc (NIH): 68 mg/dL (ref 0–99)
Triglycerides: 84 mg/dL (ref 0–149)
VLDL Cholesterol Cal: 17 mg/dL (ref 5–40)

## 2020-05-28 LAB — COMPREHENSIVE METABOLIC PANEL
ALT: 21 IU/L (ref 0–32)
AST: 15 IU/L (ref 0–40)
Albumin/Globulin Ratio: 2.2 (ref 1.2–2.2)
Albumin: 4.7 g/dL (ref 3.8–4.9)
Alkaline Phosphatase: 119 IU/L (ref 44–121)
BUN/Creatinine Ratio: 20 (ref 9–23)
BUN: 15 mg/dL (ref 6–24)
Bilirubin Total: 0.4 mg/dL (ref 0.0–1.2)
CO2: 24 mmol/L (ref 20–29)
Calcium: 9.3 mg/dL (ref 8.7–10.2)
Chloride: 104 mmol/L (ref 96–106)
Creatinine, Ser: 0.76 mg/dL (ref 0.57–1.00)
Globulin, Total: 2.1 g/dL (ref 1.5–4.5)
Glucose: 100 mg/dL — ABNORMAL HIGH (ref 65–99)
Potassium: 4.2 mmol/L (ref 3.5–5.2)
Sodium: 141 mmol/L (ref 134–144)
Total Protein: 6.8 g/dL (ref 6.0–8.5)
eGFR: 92 mL/min/{1.73_m2} (ref >59)

## 2020-05-28 LAB — CBC WITH DIFFERENTIAL/PLATELET
Basophils Absolute: 0.1 10*3/uL (ref 0.0–0.2)
Basos: 1 %
EOS (ABSOLUTE): 0.1 10*3/uL (ref 0.0–0.4)
Eos: 2 %
Hematocrit: 41.5 % (ref 34.0–46.6)
Hemoglobin: 14.1 g/dL (ref 11.1–15.9)
Immature Grans (Abs): 0 10*3/uL (ref 0.0–0.1)
Immature Granulocytes: 1 %
Lymphocytes Absolute: 1.8 10*3/uL (ref 0.7–3.1)
Lymphs: 28 %
MCH: 30.4 pg (ref 26.6–33.0)
MCHC: 34 g/dL (ref 31.5–35.7)
MCV: 89 fL (ref 79–97)
Monocytes Absolute: 0.5 10*3/uL (ref 0.1–0.9)
Monocytes: 7 %
Neutrophils Absolute: 4 10*3/uL (ref 1.4–7.0)
Neutrophils: 61 %
Platelets: 254 10*3/uL (ref 150–450)
RBC: 4.64 x10E6/uL (ref 3.77–5.28)
RDW: 12.8 % (ref 11.7–15.4)
WBC: 6.5 10*3/uL (ref 3.4–10.8)

## 2020-06-02 NOTE — Addendum Note (Signed)
Addended by: Festus Aloe on: 06/02/2020 03:06 PM   Modules accepted: Orders

## 2020-06-04 DIAGNOSIS — M79672 Pain in left foot: Secondary | ICD-10-CM | POA: Diagnosis not present

## 2020-08-02 ENCOUNTER — Telehealth (HOSPITAL_BASED_OUTPATIENT_CLINIC_OR_DEPARTMENT_OTHER): Payer: Self-pay | Admitting: Nurse Practitioner

## 2020-08-02 ENCOUNTER — Telehealth (HOSPITAL_BASED_OUTPATIENT_CLINIC_OR_DEPARTMENT_OTHER): Payer: Self-pay | Admitting: Obstetrics & Gynecology

## 2020-08-02 ENCOUNTER — Encounter (HOSPITAL_BASED_OUTPATIENT_CLINIC_OR_DEPARTMENT_OTHER): Payer: Self-pay

## 2020-08-02 NOTE — Telephone Encounter (Signed)
Called patient and left a message to call the office to set up appointment with Dr.Miller.I did in the message inform the patient that provider is not in the office.

## 2020-08-02 NOTE — Telephone Encounter (Signed)
Patient called and is a having dryness on her outer part of her vagina and irration.I did schedule the patient for when Dr.Miller comes back she stated that she not sure if she can wait that long.

## 2020-08-03 NOTE — Telephone Encounter (Signed)
See MyChart message. tbw

## 2020-08-10 ENCOUNTER — Ambulatory Visit (HOSPITAL_BASED_OUTPATIENT_CLINIC_OR_DEPARTMENT_OTHER): Payer: BC Managed Care – PPO | Admitting: Obstetrics & Gynecology

## 2020-08-10 ENCOUNTER — Encounter (HOSPITAL_BASED_OUTPATIENT_CLINIC_OR_DEPARTMENT_OTHER): Payer: Self-pay

## 2020-08-12 DIAGNOSIS — R635 Abnormal weight gain: Secondary | ICD-10-CM | POA: Diagnosis not present

## 2020-08-12 DIAGNOSIS — Z1211 Encounter for screening for malignant neoplasm of colon: Secondary | ICD-10-CM | POA: Diagnosis not present

## 2020-08-12 DIAGNOSIS — K219 Gastro-esophageal reflux disease without esophagitis: Secondary | ICD-10-CM | POA: Diagnosis not present

## 2020-08-30 ENCOUNTER — Other Ambulatory Visit: Payer: Self-pay

## 2020-08-30 ENCOUNTER — Encounter (HOSPITAL_BASED_OUTPATIENT_CLINIC_OR_DEPARTMENT_OTHER): Payer: Self-pay | Admitting: Obstetrics & Gynecology

## 2020-08-30 ENCOUNTER — Other Ambulatory Visit (HOSPITAL_COMMUNITY)
Admission: RE | Admit: 2020-08-30 | Discharge: 2020-08-30 | Disposition: A | Discharge status: Home / Self Care | Payer: BC Managed Care – PPO | Source: Ambulatory Visit | Attending: Obstetrics & Gynecology | Admitting: Obstetrics & Gynecology

## 2020-08-30 ENCOUNTER — Ambulatory Visit (INDEPENDENT_AMBULATORY_CARE_PROVIDER_SITE_OTHER): Payer: BC Managed Care – PPO | Admitting: Obstetrics & Gynecology

## 2020-08-30 VITALS — BP 122/78 | HR 74 | Ht 67.0 in | Wt 221.0 lb

## 2020-08-30 DIAGNOSIS — Z780 Asymptomatic menopausal state: Secondary | ICD-10-CM | POA: Diagnosis not present

## 2020-08-30 DIAGNOSIS — Z23 Encounter for immunization: Secondary | ICD-10-CM

## 2020-08-30 DIAGNOSIS — E785 Hyperlipidemia, unspecified: Secondary | ICD-10-CM

## 2020-08-30 DIAGNOSIS — Z124 Encounter for screening for malignant neoplasm of cervix: Secondary | ICD-10-CM | POA: Diagnosis not present

## 2020-08-30 DIAGNOSIS — Z01419 Encounter for gynecological examination (general) (routine) without abnormal findings: Secondary | ICD-10-CM

## 2020-08-30 DIAGNOSIS — R6882 Decreased libido: Secondary | ICD-10-CM

## 2020-08-30 MED ORDER — NONFORMULARY OR COMPOUNDED ITEM: 1 refills | Status: DC

## 2020-08-30 NOTE — Progress Notes (Signed)
55 y.o. G3P3 Married White or Caucasian female here for annual exam.  Has experienced lots of stressors this summer.  Husband cannot drive because of eyes now.  She is doing all of the driving.  He still is working full time as a Education officer, environmental.  Daughter is expecting her second child.  She is due in about four weeks.    Denies vaginal bleeding.  Seeing Dr. Kirke Corin once a year.  Had coronary CT in 2019.  Possibly will have repeat next year.             Sexually active: Yes.    The current method of family planning is post menopausal status.    Exercising: No.  Smoker:  no  Health Maintenance: Pap:  04/21/2016 Negative History of abnormal Pap:  no MMG:  05/13/2020 Negative Colonoscopy:  05/19/2010, scheduled for November BMD:   plan around age 63 TDaP:  update today Shingrix:   discussed today Hep C testing: will plan with Dr. Nathanial Rancher Screening Labs: same   reports that she has never smoked. She has never used smokeless tobacco. She reports that she does not drink alcohol and does not use drugs.  Past Medical History:  Diagnosis Date   Anxiety    Depression    Elevated blood pressure    hx   GERD (gastroesophageal reflux disease)    HYPERLIPIDEMIA, MILD    not on medication   MORTON'S NEUROMA, LEFT    Palpitations    sinus tach and PACs on holter and normal echo 02/2010 , no ischemia ETT 05/2011   TESTOSTERONE DEFICIENCY    Vitamin B12 deficiency 12/17/2012   Lab Results Component Value Date  VITAMINB12 250 12/17/2012      Past Surgical History:  Procedure Laterality Date   CESAREAN SECTION  5/91, 6/93, 7/02   TUBAL LIGATION      Current Outpatient Medications  Medication Sig Dispense Refill   atorvastatin (LIPITOR) 10 MG tablet TAKE 1 TABLET(10 MG) BY MOUTH DAILY 90 tablet 2   metoprolol succinate (TOPROL-XL) 25 MG 24 hr tablet TAKE 1 TABLET(25 MG) BY MOUTH DAILY 90 tablet 2   NONFORMULARY OR COMPOUNDED ITEM Topical testosterone base 2% in HRT.  Apply 1/4 tsp to inner thighs  two to three times weekly.  Disp 60 gms 1 each 1   omeprazole (PRILOSEC) 20 MG capsule Take 1 capsule (20 mg total) by mouth daily. 90 capsule 2   PARoxetine (PAXIL) 10 MG tablet Take 1 tablet (10 mg total) by mouth daily. 90 tablet 1   No current facility-administered medications for this visit.    Family History  Problem Relation Age of Onset   Hypertension Father    Hypertension Brother     Review of Systems  All other systems reviewed and are negative.  Exam:   BP 122/78 (BP Location: Right Arm, Patient Position: Sitting, Cuff Size: Large)   Pulse 74   Ht 5\' 7"  (1.702 m) Comment: reported  Wt 221 lb (100.2 kg)   BMI 34.61 kg/m   Height: 5\' 7"  (170.2 cm) (reported)  General appearance: alert, cooperative and appears stated age Head: Normocephalic, without obvious abnormality, atraumatic Neck: no adenopathy, supple, symmetrical, trachea midline and thyroid normal to inspection and palpation Lungs: clear to auscultation bilaterally Breasts: normal appearance, no masses or tenderness Heart: regular rate and rhythm Abdomen: soft, non-tender; bowel sounds normal; no masses,  no organomegaly Extremities: extremities normal, atraumatic, no cyanosis or edema Skin: Skin color, texture, turgor normal. No rashes  or lesions Lymph nodes: Cervical, supraclavicular, and axillary nodes normal. No abnormal inguinal nodes palpated Neurologic: Grossly normal   Pelvic: External genitalia:  no lesions              Urethra:  normal appearing urethra with no masses, tenderness or lesions              Bartholins and Skenes: normal                 Vagina: normal appearing vagina with normal color and no discharge, no lesions              Cervix: no lesions              Pap taken: Yes.   Bimanual Exam:  Uterus:  normal size, contour, position, consistency, mobility, non-tender              Adnexa: normal adnexa and no mass, fullness, tenderness               Rectovaginal: Confirms                Anus:  normal sphincter tone, no lesions  Chaperone, Ina Homes, CMA, was present for exam.  Assessment/Plan: 1. Well woman exam with routine gynecological exam - Pap smear with HR HPV obtained today - Mammogram 05/13/2020 - Colonoscopy 05/19/2010.  Scheduled for November. - Bone mineral density will be initiated around age 18 - lab work done done with PCP.  Hep C testing recommended. - vaccines reviewed/updated.   - Given today;  Tdap vaccine greater than or equal to 7yo   2. Decreased libido - normal serum level obtained last year.  Consider repeating next year. - NONFORMULARY OR COMPOUNDED ITEM; Topical testosterone base 2% in HRT.  Apply 1/4 tsp to inner thighs two to three times weekly.  Disp 60 gms  Dispense: 1 each; Refill: 1  3. Postmenopausal - no HRT  4. Hyperlipidemia, mild and h/o palpitations - followed by cardiology, Dr. Kirke Corin

## 2020-09-01 LAB — CYTOLOGY - PAP
Adequacy: ABSENT
Comment: NEGATIVE
Diagnosis: NEGATIVE
High risk HPV: NEGATIVE

## 2020-09-02 DIAGNOSIS — R6882 Decreased libido: Secondary | ICD-10-CM | POA: Insufficient documentation

## 2020-11-06 ENCOUNTER — Other Ambulatory Visit: Payer: Self-pay | Admitting: Cardiovascular Disease

## 2020-11-08 ENCOUNTER — Other Ambulatory Visit (HOSPITAL_BASED_OUTPATIENT_CLINIC_OR_DEPARTMENT_OTHER): Payer: Self-pay | Admitting: Obstetrics & Gynecology

## 2020-11-08 ENCOUNTER — Encounter (HOSPITAL_BASED_OUTPATIENT_CLINIC_OR_DEPARTMENT_OTHER): Payer: Self-pay

## 2020-11-08 MED ORDER — PAROXETINE HCL 10 MG PO TABS: 10.0000 mg | ORAL_TABLET | Freq: Every day | ORAL | 3 refills | Status: DC

## 2020-11-11 DIAGNOSIS — Z23 Encounter for immunization: Secondary | ICD-10-CM | POA: Diagnosis not present

## 2020-11-11 DIAGNOSIS — G8929 Other chronic pain: Secondary | ICD-10-CM | POA: Diagnosis not present

## 2020-11-11 DIAGNOSIS — Z6835 Body mass index (BMI) 35.0-35.9, adult: Secondary | ICD-10-CM | POA: Diagnosis not present

## 2020-11-11 DIAGNOSIS — M79672 Pain in left foot: Secondary | ICD-10-CM | POA: Diagnosis not present

## 2020-11-11 DIAGNOSIS — L282 Other prurigo: Secondary | ICD-10-CM | POA: Diagnosis not present

## 2021-01-07 ENCOUNTER — Telehealth: Payer: Self-pay | Admitting: Cardiovascular Disease

## 2021-01-07 NOTE — Telephone Encounter (Signed)
Spoke with the patient. Patient reports two episodes of right arm numbness. 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  Patient denies palpitations, chest pain, n/v, diaphoresis. Nothing makes it better or worse, not associated with exertion.  Advised the patient that it is unlikely she is having a cardiac event.  Adv the patient that to keep her scheduled appt with Christell Faith, PA. She is to contact the office in the interim if cardiac symptoms develop.  Patient agreeable with the plan and voiced appreciation for the call.

## 2021-01-07 NOTE — Telephone Encounter (Signed)
Patient states numbness in right arm while sitting & standing. Comes & goes for the last week. Please advise

## 2021-01-13 NOTE — Progress Notes (Signed)
Office Visit    Patient Name: Alyssa Pratt Date of Encounter: 01/14/2021  PCP:  Ailene Ravel, MD    [facility]  Cardiologist:  Lorine Bears, MD  Advanced Practice Provider:  No care team member to display Electrophysiologist:  None    HPI:     Alyssa Pratt is a 56 y.o. female with a hx of PACs, GERD, hyperlipidemia, hypertension, depression, and anxiety presents today for follow-up regarding her palpitations.  She had an echocardiogram done in 2012 and 2014 which showed normal LV systolic function without significant structural or valvular abnormalities.  She had a Holter monitor placed which showed few PACs without any significant arrhythmia.  She had sinus tachycardia with slightly elevated mean average heart rate.  Her symptoms have been well controlled on low-dose Toprol.  She has a known history of chronic atypical chest pain.  Most recent treadmill test in March 2019 was normal.  She has a history of hyperlipidemia, previous calcium score in 2019 was 7.  She is on low-dose atorvastatin.  She was last seen in the office May 2022 with Dr. Citronelle Sink.  She was doing well at that time without any shortness of breath or palpitations.  She has had rare episodes of sharp chest pain lasting only a few seconds which is unchanged from before.  Today, she shares that she has had random numbness and tingling down her right arm.  Most the time it is in the evening with right-sided pain in her hand.  She has had no chest pain or shortness of breath.  She does have the occasional fluttering in her chest which is not new which also occurs randomly throughout the day.  She had a coronary CT back in 2019 which showed a calcium score of 7.  Her symptomatic PACs have been much improved since starting Toprol.  She denies any lightheadedness or dizziness.  She denies any syncope or presyncope.  She has not had any lower extremity edema.  Otherwise she is felt completely  fine without any other symptoms.  She does not do anything strenuous that would have caused a musculoskeletal injury to her right side nor has she done any repetitive movements that would cause her symptoms.  She and her husband are heading out of town in 2 weeks to see their son therefore, if any studies were ordered she would like to do them in the next 2 weeks if possible.She states that her grandfather had CAD and her brother recently underwent CABG x 62 at age 57 years old.   Reports no shortness of breath nor dyspnea on exertion. Reports no chest pain, pressure, or tightness. No edema, orthopnea, PND.   Past Medical History    Past Medical History:  Diagnosis Date   Anxiety    Depression    Elevated blood pressure    hx   GERD (gastroesophageal reflux disease)    HYPERLIPIDEMIA, MILD    not on medication   MORTON'S NEUROMA, LEFT    Palpitations    sinus tach and PACs on holter and normal echo 02/2010 , no ischemia ETT 05/2011   TESTOSTERONE DEFICIENCY    Vitamin B12 deficiency 12/17/2012   Lab Results Component Value Date  VITAMINB12 250 12/17/2012     Past Surgical History:  Procedure Laterality Date   CESAREAN SECTION  5/91, 6/93, 7/02   TUBAL LIGATION      Allergies  Allergies  Allergen Reactions   Latex  rash    EKGs/Labs/Other Studies Reviewed:   The following studies were reviewed today: CLINICAL DATA:  Risk stratification   EXAM: Coronary Calcium Score   MEDICATIONS: None   TECHNIQUE: The patient was scanned on a [employer] scanner. Axial non-contrast 3 mm slices were carried out through the heart. The data set was analyzed on a dedicated work station and scored using the Agatson method.   FINDINGS: Non-cardiac: [facility].   Ascending Aorta: Normal size, trivial calcifications in the aortic root.   Pericardium: Normal.   Coronary arteries: Normal origin.   IMPRESSION: Coronary calcium score of 7. This  was 684 percentile for age and sex matched control.   Electronically Signed: By: Tobias AlexanderKatarina  Nelson  Study Conclusions   Echocardiogram 04/04/12  - Left ventricle: The cavity size was normal. Wall thickness    was normal. Systolic function was normal. The estimated    ejection fraction was in the range of 55% to 60%. Wall    motion was normal; there were no regional wall motion    abnormalities. Left ventricular diastolic function    parameters were normal.  - Aortic valve: There was no stenosis.  - Mitral valve: No significant regurgitation.  - Right ventricle: The cavity size was normal. Systolic    function was normal.  - Pulmonary arteries: PA peak pressure: 21mm Hg (S).  - Inferior vena cava: The vessel was normal in size; the    respirophasic diameter changes were in the normal range (=    50%); findings are consistent with normal central venous    pressure.   EKG:  EKG is  ordered today.  The ekg ordered today demonstrates NSR in the 70s  Recent Labs: 05/27/2020: ALT 21; BUN 15; Creatinine, Ser 0.76; Hemoglobin 14.1; Platelets 254; Potassium 4.2; Sodium 141  Recent Lipid Panel    Component Value Date/Time   CHOL 118 05/27/2020 0853   TRIG 84 05/27/2020 0853   HDL 33 (L) 05/27/2020 0853   CHOLHDL 3.6 05/27/2020 0853   CHOLHDL 3.5 09/21/2017 1005   VLDL 15 09/21/2017 1005   LDLCALC 68 05/27/2020 0853     Home Medications   Current Meds  Medication Sig   atorvastatin (LIPITOR) 10 MG tablet TAKE 1 TABLET(10 MG) BY MOUTH DAILY   metoprolol succinate (TOPROL-XL) 25 MG 24 hr tablet TAKE 1 TABLET(25 MG) BY MOUTH DAILY   omeprazole (PRILOSEC) 20 MG capsule Take 1 capsule (20 mg total) by mouth daily.   PARoxetine (PAXIL) 10 MG tablet Take 1 tablet (10 mg total) by mouth daily.     Review of Systems      All other systems reviewed and are otherwise negative except as noted above.  Physical Exam    VS:  BP 102/90 (BP Location: Left Arm, Patient Position: Sitting, Cuff  Size: Normal)    Pulse 78    Ht 5\' 7"  (1.702 m)    Wt 220 lb (99.8 kg)    SpO2 98%    BMI 34.46 kg/m  , BMI Body mass index is 34.46 kg/m.  Wt Readings from Last 3 Encounters:  01/14/21 220 lb (99.8 kg)  08/30/20 221 lb (100.2 kg)  05/27/20 216 lb 6 oz (98.1 kg)     GEN: Well nourished, well developed, in no acute distress. HEENT: normal. Neck: Supple, no JVD, carotid bruits, or masses. Cardiac: RRR, no murmurs, rubs, or gallops. No clubbing, cyanosis, edema.  Radials/PT 2+ and equal bilaterally.  Respiratory:  Respirations regular and unlabored, clear to auscultation bilaterally. GI: Soft, nontender, nondistended. MS: No deformity or atrophy. Skin: Warm and dry, no rash. Neuro:  Strength and sensation are intact. Psych: Normal affect.  Assessment & Plan    Symptomatic PACs -Symptoms well controlled with Toprol and Paxil for anxiety -fluttering in her chest which is random, no lightheaded or dizziness   Atypical chest pain -Negative treadmill stress test 2019 -Low coronary calcium score 2019 -since she is having new onset right sided numbness and tingling with right hand pain with no clear orthopedic reason and a family history of CAD would order coronary CT scan to r/o ischemia.   Mild hyperlipidemia -Tolerating atorvastatin 10 mg daily -Most recent lipid panel showed total cholesterol 118, HDL 33, LDL 68, triglycerides 84 back in May 2022 -Will repeat May 2023   Disposition: Follow up 1 month  with Lorine Bears, MD or APP.  Signed, Sharlene Dory, PA-C 01/14/2021, 4:27 PM  [facility]

## 2021-01-14 ENCOUNTER — Encounter: Payer: Self-pay | Admitting: Physician Assistant

## 2021-01-14 ENCOUNTER — Other Ambulatory Visit: Payer: Self-pay

## 2021-01-14 ENCOUNTER — Ambulatory Visit: Payer: BC Managed Care – PPO | Admitting: Physician Assistant

## 2021-01-14 VITALS — BP 102/90 | HR 78 | Ht 67.0 in | Wt 220.0 lb

## 2021-01-14 DIAGNOSIS — E782 Mixed hyperlipidemia: Secondary | ICD-10-CM

## 2021-01-14 DIAGNOSIS — I491 Atrial premature depolarization: Secondary | ICD-10-CM | POA: Diagnosis not present

## 2021-01-14 DIAGNOSIS — E785 Hyperlipidemia, unspecified: Secondary | ICD-10-CM

## 2021-01-14 DIAGNOSIS — R072 Precordial pain: Secondary | ICD-10-CM

## 2021-01-14 MED ORDER — METOPROLOL TARTRATE 100 MG PO TABS: 100.0000 mg | ORAL_TABLET | Freq: Once | ORAL | 0 refills | Status: DC

## 2021-01-14 NOTE — Patient Instructions (Signed)
Medication Instructions:  No changes at this time.  *If you need a refill on your cardiac medications before your next appointment, please call your pharmacy*   Lab Work: None  If you have labs (blood work) drawn today and your tests are completely normal, you will receive your results only by: [place] (if you have MyChart) OR A paper copy in the mail If you have any lab test that is abnormal or we need to change your treatment, we will call you to review the results.   Testing/Procedures:   Your cardiac CT will be scheduled at:   [facility] [address] [place], [place] [address] [phone]  February 03, 2021 at 09:30 am  Please arrive 15 mins early for check-in and test prep.  Please follow these instructions carefully (unless otherwise directed):  Hold all erectile dysfunction medications at least 3 days (72 hrs) prior to test.  On the Night Before the Test: Be sure to Drink plenty of water. Do not consume any caffeinated/decaffeinated beverages or chocolate 12 hours prior to your test. Do not take any antihistamines 12 hours prior to your test.   On the Day of the Test: Drink plenty of water until 1 hour prior to the test. Do not eat any food 4 hours prior to the test. You may take your regular medications prior to the test.  Take metoprolol (Lopressor) 100 mg two hours prior to test. HOLD Furosemide/Hydrochlorothiazide morning of the test. FEMALES- please wear underwire-free bra if available, avoid dresses & tight clothing       After the Test: Drink plenty of water. After receiving IV contrast, you may experience a mild flushed feeling. This is normal. On occasion, you may experience a mild rash up to 24 hours after the test. This is not dangerous. If this occurs, you can take Benadryl 25 mg and increase your fluid intake. If you experience trouble breathing, this can be serious. If it is severe call  911 IMMEDIATELY. If it is mild, please call our office. If you take any of these medications: Glipizide/Metformin, Avandament, Glucavance, please do not take 48 hours after completing test unless otherwise instructed.  We will call to schedule your test 2-4 weeks out understanding that some insurance companies will need an authorization prior to the service being performed.   For non-scheduling related questions, please contact the cardiac imaging nurse navigator should you have any questions/concerns: Marchia Bond, Cardiac Imaging Nurse Navigator Gordy Clement, Cardiac Imaging Nurse Navigator [facility] Direct Office Dial: [phone]   For scheduling needs, including cancellations and rescheduling, please call [place], [phone].    Follow-Up: At [facility], you and your health needs are our priority.  As part of our continuing mission to provide you with exceptional heart care, we have created designated Provider Care Teams.  These Care Teams include your primary Cardiologist (physician) and Advanced Practice Providers (APPs -  Physician Assistants and Nurse Practitioners) who all work together to provide you with the care you need, when you need it.   Your next appointment:   1 month(s)  The format for your next appointment:   In Person  Provider:   Kathlyn Sacramento, MD or Christell Faith, PA-C

## 2021-02-01 ENCOUNTER — Telehealth (HOSPITAL_COMMUNITY): Payer: Self-pay | Admitting: *Deleted

## 2021-02-01 NOTE — Telephone Encounter (Signed)
Reaching out to patient to offer assistance regarding upcoming cardiac imaging study; pt verbalizes understanding of appt date/time, parking situation and where to check in, pre-test NPO status and medications ordered, and verified current allergies; name and call back number provided for further questions should they arise  Larey Brick RN Navigator Cardiac Imaging [facility] [phone] office [phone] cell  Patient to take 100mg  metoprolol tartrate two hours prior to cardiac CT scan. She will hold her evening metoprolol succinate the night before.

## 2021-02-03 ENCOUNTER — Other Ambulatory Visit: Payer: Self-pay | Admitting: Cardiovascular Disease

## 2021-02-03 ENCOUNTER — Ambulatory Visit
Admission: RE | Admit: 2021-02-03 | Discharge: 2021-02-03 | Disposition: A | Discharge status: Home / Self Care | Payer: BC Managed Care – PPO | Source: Ambulatory Visit | Attending: Physician Assistant | Admitting: Physician Assistant

## 2021-02-03 ENCOUNTER — Other Ambulatory Visit: Payer: Self-pay

## 2021-02-03 DIAGNOSIS — R072 Precordial pain: Secondary | ICD-10-CM | POA: Diagnosis not present

## 2021-02-03 LAB — POCT I-STAT CREATININE: Creatinine, Ser: 0.8 mg/dL (ref 0.44–1.00)

## 2021-02-03 MED ORDER — METOPROLOL TARTRATE 5 MG/5ML IV SOLN
5.0000 mg | Freq: Once | INTRAVENOUS | Status: AC
  Administered 2021-02-03: 5 mg via INTRAVENOUS

## 2021-02-03 MED ORDER — METOPROLOL TARTRATE 5 MG/5ML IV SOLN
10.0000 mg | Freq: Once | INTRAVENOUS | Status: AC
  Administered 2021-02-03: 10 mg via INTRAVENOUS

## 2021-02-03 MED ORDER — NITROGLYCERIN 0.4 MG SL SUBL
0.8000 mg | SUBLINGUAL_TABLET | Freq: Once | SUBLINGUAL | Status: AC
  Administered 2021-02-03: 0.8 mg via SUBLINGUAL

## 2021-02-03 MED ORDER — IOHEXOL 350 MG/ML SOLN
100.0000 mL | Freq: Once | INTRAVENOUS | Status: AC | PRN
  Administered 2021-02-03: 100 mL via INTRAVENOUS

## 2021-02-03 NOTE — Progress Notes (Signed)
Patient tolerated procedure well. Ambulate w/o difficulty. Denies light headedness or being dizzy. Sitting in chair drinking water provided. Encouraged to drink extra water today and reasoning explained. Verbalized understanding. All questions answered. ABC intact. No further needs. Discharge from procedure area w/o issues.   °

## 2021-02-14 ENCOUNTER — Ambulatory Visit: Payer: BC Managed Care – PPO | Admitting: Physician Assistant

## 2021-02-15 ENCOUNTER — Other Ambulatory Visit: Payer: Self-pay | Admitting: Physician Assistant

## 2021-02-28 DIAGNOSIS — K6389 Other specified diseases of intestine: Secondary | ICD-10-CM | POA: Diagnosis not present

## 2021-02-28 DIAGNOSIS — K649 Unspecified hemorrhoids: Secondary | ICD-10-CM | POA: Diagnosis not present

## 2021-02-28 DIAGNOSIS — K635 Polyp of colon: Secondary | ICD-10-CM | POA: Diagnosis not present

## 2021-02-28 DIAGNOSIS — Z1211 Encounter for screening for malignant neoplasm of colon: Secondary | ICD-10-CM | POA: Diagnosis not present

## 2021-02-28 DIAGNOSIS — D124 Benign neoplasm of descending colon: Secondary | ICD-10-CM | POA: Diagnosis not present

## 2021-05-03 DIAGNOSIS — S39012A Strain of muscle, fascia and tendon of lower back, initial encounter: Secondary | ICD-10-CM | POA: Diagnosis not present

## 2021-05-26 ENCOUNTER — Encounter: Payer: Self-pay | Admitting: Cardiovascular Disease

## 2021-05-26 ENCOUNTER — Ambulatory Visit: Payer: BC Managed Care – PPO | Admitting: Cardiovascular Disease

## 2021-05-26 VITALS — BP 120/78 | HR 84 | Ht 68.0 in | Wt 221.0 lb

## 2021-05-26 DIAGNOSIS — I493 Ventricular premature depolarization: Secondary | ICD-10-CM | POA: Diagnosis not present

## 2021-05-26 DIAGNOSIS — E785 Hyperlipidemia, unspecified: Secondary | ICD-10-CM | POA: Diagnosis not present

## 2021-05-26 NOTE — Patient Instructions (Signed)

## 2021-05-26 NOTE — Progress Notes (Signed)
Cardiology Office Note   Date:  05/26/2021   ID:  Alyssa Pratt, DOB 1965-01-07, MRN [id number]  PCP:  Ailene Ravel, MD  Cardiologist:   Lorine Bears, MD   Chief Complaint  Patient presents with   Follow-up      History of Present Illness: Alyssa Pratt is a 56 y.o. female who presents for a followup visit regarding palpitations due to documented PACs. She had an echocardiogram done in 2012 and 2014 which showed normal LV systolic function without significant structural or valvular abnormalities. She had a Holter monitor done which showed few PACs without any other significant arrhythmia. She had sinus tachycardia with slightly elevated mean average heart rate. Her symptoms have been well controlled on small dose Toprol.    She has known history of chronic atypical chest pain.    Previous CT calcium score in 2019 was mildly abnormal at 7.    She had recurrent atypical chest pain earlier this year and underwent cardiac CTA in February which showed calcium score of 0 with no coronary artery disease.  She is feeling well with no chest pain, shortness of breath or palpitations.  She continues to take Toprol.  She has an upcoming trip to [place].  Past Medical History:  Diagnosis Date   Anxiety    Depression    Elevated blood pressure    hx   GERD (gastroesophageal reflux disease)    HYPERLIPIDEMIA, MILD    not on medication   MORTON'S NEUROMA, LEFT    Palpitations    sinus tach and PACs on holter and normal echo 02/2010 , no ischemia ETT 05/2011   TESTOSTERONE DEFICIENCY    Vitamin B12 deficiency 12/17/2012   Lab Results Component Value Date  VITAMINB12 250 12/17/2012      Past Surgical History:  Procedure Laterality Date   CESAREAN SECTION  5/91, 6/93, 7/02   TUBAL LIGATION       Current Outpatient Medications  Medication Sig Dispense Refill   atorvastatin (LIPITOR) 10 MG tablet TAKE 1 TABLET(10 MG) BY MOUTH DAILY 90 tablet 0   metoprolol succinate  (TOPROL-XL) 25 MG 24 hr tablet TAKE 1 TABLET(25 MG) BY MOUTH DAILY 90 tablet 2   omeprazole (PRILOSEC) 20 MG capsule Take 1 capsule (20 mg total) by mouth daily. 90 capsule 2   PARoxetine (PAXIL) 10 MG tablet Take 1 tablet (10 mg total) by mouth daily. 90 tablet 3   No current facility-administered medications for this visit.    Allergies:   Latex    Social History:  The patient  reports that she has never smoked. She has never used smokeless tobacco. She reports that she does not drink alcohol and does not use drugs.   Family History:  The patient's family history includes Hypertension in her brother and father.    ROS:  Please see the history of present illness.   Otherwise, review of systems are positive for none.   All other systems are reviewed and negative.    PHYSICAL EXAM: VS:  BP 120/78 (BP Location: Left Arm, Patient Position: Sitting, Cuff Size: Large)   Pulse 84   Ht 5\' 8"  (1.727 m)   Wt 221 lb (100.2 kg)   SpO2 98%   BMI 33.60 kg/m  , BMI Body mass index is 33.6 kg/m. GEN: Well nourished, well developed, in no acute distress  HEENT: normal  Neck: no JVD, carotid bruits, or masses Cardiac: RRR; no murmurs, rubs, or gallops,no edema  Respiratory:  clear to auscultation bilaterally, normal work of breathing GI: soft, nontender, nondistended, + BS MS: no deformity or atrophy  Skin: warm and dry, no rash Neuro:  Strength and sensation are intact Psych: euthymic mood, full affect   EKG:  EKG is not ordered today.   Recent Labs: 05/27/2020: ALT 21; BUN 15; Hemoglobin 14.1; Platelets 254; Potassium 4.2; Sodium 141 02/03/2021: Creatinine, Ser 0.80    Lipid Panel    Component Value Date/Time   CHOL 118 05/27/2020 0853   TRIG 84 05/27/2020 0853   HDL 33 (L) 05/27/2020 0853   CHOLHDL 3.6 05/27/2020 0853   CHOLHDL 3.5 09/21/2017 1005   VLDL 15 09/21/2017 1005   LDLCALC 68 05/27/2020 0853      Wt Readings from Last 3 Encounters:  05/26/21 221 lb (100.2 kg)   01/14/21 220 lb (99.8 kg)  08/30/20 221 lb (100.2 kg)        ASSESSMENT AND PLAN:  1.  Symptomatic PACs: Symptoms are well-controlled with Toprol and Paxil for anxiety.   2. Atypical chest pain: Cardiac CTA in February of this year showed normal coronary arteries with calcium score of 0.   3.  Mild hyperlipidemia: She continues to take small dose atorvastatin 10 mg daily.  Most recent lipid profile from last year showed an LDL of 68.  4.  Long distance travel: She is planning a trip to [place] in the near future.  She will have a 10-hour flight.  I discussed ways to minimize the risk of DVT including walking and stretching every 2 hours    Disposition:   FU with me in 1 year  Signed,  Lorine Bears, MD  05/26/2021 10:19 AM    [facility]

## 2021-06-24 ENCOUNTER — Other Ambulatory Visit: Payer: Self-pay | Admitting: Obstetrics & Gynecology

## 2021-06-24 DIAGNOSIS — Z1231 Encounter for screening mammogram for malignant neoplasm of breast: Secondary | ICD-10-CM

## 2021-07-21 ENCOUNTER — Ambulatory Visit
Admission: RE | Admit: 2021-07-21 | Discharge: 2021-07-21 | Disposition: A | Discharge status: Home / Self Care | Payer: BC Managed Care – PPO | Source: Ambulatory Visit | Attending: Obstetrics & Gynecology | Admitting: Obstetrics & Gynecology

## 2021-07-21 DIAGNOSIS — Z1231 Encounter for screening mammogram for malignant neoplasm of breast: Secondary | ICD-10-CM | POA: Diagnosis not present

## 2021-07-29 ENCOUNTER — Other Ambulatory Visit: Payer: Self-pay | Admitting: Cardiovascular Disease

## 2021-08-03 ENCOUNTER — Other Ambulatory Visit (HOSPITAL_BASED_OUTPATIENT_CLINIC_OR_DEPARTMENT_OTHER): Payer: Self-pay | Admitting: Obstetrics & Gynecology

## 2021-09-07 ENCOUNTER — Ambulatory Visit (INDEPENDENT_AMBULATORY_CARE_PROVIDER_SITE_OTHER): Payer: BC Managed Care – PPO | Admitting: Obstetrics & Gynecology

## 2021-09-07 ENCOUNTER — Encounter (HOSPITAL_BASED_OUTPATIENT_CLINIC_OR_DEPARTMENT_OTHER): Payer: Self-pay | Admitting: Obstetrics & Gynecology

## 2021-09-07 VITALS — BP 132/78 | HR 77 | Ht 67.5 in | Wt 223.0 lb

## 2021-09-07 DIAGNOSIS — F419 Anxiety disorder, unspecified: Secondary | ICD-10-CM | POA: Diagnosis not present

## 2021-09-07 DIAGNOSIS — Z780 Asymptomatic menopausal state: Secondary | ICD-10-CM | POA: Diagnosis not present

## 2021-09-07 DIAGNOSIS — E785 Hyperlipidemia, unspecified: Secondary | ICD-10-CM

## 2021-09-07 DIAGNOSIS — R6882 Decreased libido: Secondary | ICD-10-CM

## 2021-09-07 DIAGNOSIS — Z23 Encounter for immunization: Secondary | ICD-10-CM

## 2021-09-07 DIAGNOSIS — Z0000 Encounter for general adult medical examination without abnormal findings: Secondary | ICD-10-CM

## 2021-09-07 DIAGNOSIS — Z01419 Encounter for gynecological examination (general) (routine) without abnormal findings: Secondary | ICD-10-CM | POA: Diagnosis not present

## 2021-09-07 MED ORDER — NONFORMULARY OR COMPOUNDED ITEM: 1 refills | Status: DC

## 2021-09-07 MED ORDER — PAROXETINE HCL 10 MG PO TABS: ORAL_TABLET | ORAL | 4 refills | Status: DC

## 2021-09-07 NOTE — Progress Notes (Signed)
56 y.o. G3P3 Married White or Caucasian female here for annual exam.  Went to [place] this summer with [employer].  Luggage was lost for 5 days but trip was wonderful.  Denies vaginal bleeding.    Hasn't seen PCP in several years.  Needs blood work today if can be done.  Continues with low desire for intercourse.  Would like to restart topical testosterone.  Has used in the past.    On Paxil.  Continues to do well with this.  Refill needed.  Sexually active: Yes.    The current method of family planning is post menopausal status.    Exercising: not Smoker:  no  Health Maintenance: Pap:  08/30/2020 Negative History of abnormal Pap:  no MMG:  07/21/2021 Negative Colonoscopy:  02/28/2021 BMD:   not indicated yet Screening Labs: hasn't seen PCP in a few years.  Requests blood work today.   reports that she has never smoked. She has never used smokeless tobacco. She reports that she does not drink alcohol and does not use drugs.  Past Medical History:  Diagnosis Date   Anxiety    Depression    Elevated blood pressure    hx   GERD (gastroesophageal reflux disease)    HYPERLIPIDEMIA, MILD    not on medication   MORTON'S NEUROMA, LEFT    Palpitations    sinus tach and PACs on holter and normal echo 02/2010 , no ischemia ETT 05/2011   TESTOSTERONE DEFICIENCY    Vitamin B12 deficiency 12/17/2012   Lab Results Component Value Date  VITAMINB12 250 12/17/2012      Past Surgical History:  Procedure Laterality Date   CESAREAN SECTION  5/91, 6/93, 7/02   TUBAL LIGATION      Current Outpatient Medications  Medication Sig Dispense Refill   atorvastatin (LIPITOR) 10 MG tablet TAKE 1 TABLET(10 MG) BY MOUTH DAILY 90 tablet 3   metoprolol succinate (TOPROL-XL) 25 MG 24 hr tablet TAKE 1 TABLET(25 MG) BY MOUTH DAILY 90 tablet 3   NONFORMULARY OR COMPOUNDED ITEM Topical testosterone cream 1mg /0.65ml.  Apply topically 0.71ml topically three times weekly.  Disp: 3 month supply.   #1RF. 1 each 1   omeprazole (PRILOSEC) 20 MG capsule Take 1 capsule (20 mg total) by mouth daily. 90 capsule 2   PARoxetine (PAXIL) 10 MG tablet TAKE 1 TABLET(10 MG) BY MOUTH DAILY 90 tablet 4   No current facility-administered medications for this visit.    Family History  Problem Relation Age of Onset   Hypertension Father    Hypertension Brother     ROS: Constitutional: negative Genitourinary:negative  Exam:   BP 132/78 (BP Location: Right Arm, Patient Position: Sitting, Cuff Size: Large)   Pulse 77   Ht 5' 7.5" (1.715 m)   Wt 223 lb (101.2 kg)   LMP 06/01/2017   BMI 34.41 kg/m   Height: 5' 7.5" (171.5 cm)  General appearance: alert, cooperative and appears stated age Head: Normocephalic, without obvious abnormality, atraumatic Neck: no adenopathy, supple, symmetrical, trachea midline and thyroid normal to inspection and palpation Lungs: clear to auscultation bilaterally Breasts: normal appearance, no masses or tenderness Heart: regular rate and rhythm Abdomen: soft, non-tender; bowel sounds normal; no masses,  no organomegaly Extremities: extremities normal, atraumatic, no cyanosis or edema Skin: Skin color, texture, turgor normal. No rashes or lesions Lymph nodes: Cervical, supraclavicular, and axillary nodes normal. No abnormal inguinal nodes palpated Neurologic: Grossly normal   Pelvic: External genitalia:  no lesions  Urethra:  normal appearing urethra with no masses, tenderness or lesions              Bartholins and Skenes: normal                 Vagina: normal appearing vagina with normal color and no discharge, no lesions              Cervix: no lesions              Pap taken: No. Bimanual Exam:  Uterus:  normal size, contour, position, consistency, mobility, non-tender              Adnexa: normal adnexa and no mass, fullness, tenderness               Rectovaginal: Confirms               Anus:  normal sphincter tone, no lesions  Chaperone,  Ina Homes, CMA, was present for exam.  Assessment/Plan: 1. Well woman exam with routine gynecological exam - Pap smear 08/30/2020 - Mammogram 07/2021 - Colonoscopy 02/2021 - Bone mineral density not indicated - lab work ordered today - vaccines reviewed/updated.  Flu shot ordered for today.  2. Postmenopausal - no HRT  3. Anxiety - PARoxetine (PAXIL) 10 MG tablet; TAKE 1 TABLET(10 MG) BY MOUTH DAILY  Dispense: 90 tablet; Refill: 4  4. Decreased libido - will restart topical testosterone.  Compounded rx to [facility] will be sent today.  5. Blood tests for routine general physical examination - CBC - Comprehensive metabolic panel - Hemoglobin A1c - TSH  6. Need for immunization against influenza - Flu Vaccine QUAD 75mo+IM (Fluarix, Fluzone & Alfiuria Quad PF)  7. Hyperlipidemia, mild - followed by cardiology

## 2021-09-08 DIAGNOSIS — Z780 Asymptomatic menopausal state: Secondary | ICD-10-CM | POA: Insufficient documentation

## 2021-09-08 LAB — CBC
Hematocrit: 40.8 % (ref 34.0–46.6)
Hemoglobin: 13.6 g/dL (ref 11.1–15.9)
MCH: 30.2 pg (ref 26.6–33.0)
MCHC: 33.3 g/dL (ref 31.5–35.7)
MCV: 91 fL (ref 79–97)
Platelets: 301 10*3/uL (ref 150–450)
RBC: 4.5 x10E6/uL (ref 3.77–5.28)
RDW: 12.5 % (ref 11.7–15.4)
WBC: 6.1 10*3/uL (ref 3.4–10.8)

## 2021-09-08 LAB — COMPREHENSIVE METABOLIC PANEL
ALT: 23 IU/L (ref 0–32)
AST: 17 IU/L (ref 0–40)
Albumin/Globulin Ratio: 2.1 (ref 1.2–2.2)
Albumin: 4.7 g/dL (ref 3.8–4.9)
Alkaline Phosphatase: 113 IU/L (ref 44–121)
BUN/Creatinine Ratio: 18 (ref 9–23)
BUN: 15 mg/dL (ref 6–24)
Bilirubin Total: 0.5 mg/dL (ref 0.0–1.2)
CO2: 24 mmol/L (ref 20–29)
Calcium: 10.1 mg/dL (ref 8.7–10.2)
Chloride: 104 mmol/L (ref 96–106)
Creatinine, Ser: 0.83 mg/dL (ref 0.57–1.00)
Globulin, Total: 2.2 g/dL (ref 1.5–4.5)
Glucose: 85 mg/dL (ref 70–99)
Potassium: 4.2 mmol/L (ref 3.5–5.2)
Sodium: 142 mmol/L (ref 134–144)
Total Protein: 6.9 g/dL (ref 6.0–8.5)
eGFR: 82 mL/min/{1.73_m2} (ref >59)

## 2021-09-08 LAB — HEMOGLOBIN A1C
Est. average glucose Bld gHb Est-mCnc: 114 mg/dL
Hgb A1c MFr Bld: 5.6 % (ref 4.8–5.6)

## 2021-09-08 LAB — TSH: TSH: 3.9 u[IU]/mL (ref 0.450–4.500)

## 2022-07-24 ENCOUNTER — Other Ambulatory Visit: Payer: Self-pay | Admitting: *Deleted

## 2022-07-25 ENCOUNTER — Other Ambulatory Visit: Payer: Self-pay

## 2022-07-25 MED ORDER — ATORVASTATIN CALCIUM 10 MG PO TABS: ORAL_TABLET | ORAL | 0 refills | Status: DC

## 2022-08-01 MED ORDER — METOPROLOL SUCCINATE ER 25 MG PO TB24: ORAL_TABLET | ORAL | 0 refills | Status: DC

## 2022-08-02 ENCOUNTER — Telehealth: Payer: Self-pay | Admitting: Cardiovascular Disease

## 2022-08-02 MED ORDER — METOPROLOL SUCCINATE ER 25 MG PO TB24: ORAL_TABLET | ORAL | 2 refills | Status: DC

## 2022-08-02 NOTE — Telephone Encounter (Signed)
*  STAT* If patient is at the pharmacy, call can be transferred to refill team.   1. Which medications need to be refilled? (please list name of each medication and dose if known)  metoprolol succinate (TOPROL-XL) 25 MG 24 hr tablet   2. Which pharmacy/location (including street and city if local pharmacy) is medication to be sent to? [facility] [phone] - [place], [place] - [address]    5. Do they need a 30 day or 90 day supply? Needs enough medication to last her until 10/10 appt.

## 2022-08-08 ENCOUNTER — Other Ambulatory Visit: Payer: Self-pay | Admitting: Cardiovascular Disease

## 2022-08-25 ENCOUNTER — Other Ambulatory Visit: Payer: Self-pay | Admitting: Cardiovascular Disease

## 2022-09-12 ENCOUNTER — Ambulatory Visit (HOSPITAL_BASED_OUTPATIENT_CLINIC_OR_DEPARTMENT_OTHER): Payer: BC Managed Care – PPO | Admitting: Obstetrics & Gynecology

## 2022-09-12 ENCOUNTER — Encounter (HOSPITAL_BASED_OUTPATIENT_CLINIC_OR_DEPARTMENT_OTHER): Payer: Self-pay | Admitting: Obstetrics & Gynecology

## 2022-09-12 VITALS — BP 131/78 | HR 86 | Ht 67.5 in | Wt 222.6 lb

## 2022-09-12 DIAGNOSIS — R6882 Decreased libido: Secondary | ICD-10-CM | POA: Diagnosis not present

## 2022-09-12 DIAGNOSIS — Z23 Encounter for immunization: Secondary | ICD-10-CM

## 2022-09-12 DIAGNOSIS — Z01419 Encounter for gynecological examination (general) (routine) without abnormal findings: Secondary | ICD-10-CM | POA: Diagnosis not present

## 2022-09-12 DIAGNOSIS — F419 Anxiety disorder, unspecified: Secondary | ICD-10-CM

## 2022-09-12 DIAGNOSIS — Z780 Asymptomatic menopausal state: Secondary | ICD-10-CM

## 2022-09-12 DIAGNOSIS — Z6834 Body mass index (BMI) 34.0-34.9, adult: Secondary | ICD-10-CM

## 2022-09-12 LAB — HEMOGLOBIN A1C
Est. average glucose Bld gHb Est-mCnc: 114 mg/dL
Hgb A1c MFr Bld: 5.6 % (ref 4.8–5.6)

## 2022-09-12 MED ORDER — PAROXETINE HCL 10 MG PO TABS: ORAL_TABLET | ORAL | 4 refills | Status: DC

## 2022-09-12 NOTE — Progress Notes (Signed)
57 y.o. G3P3 Married White or Caucasian female here for annual exam.  Doing well.  Denies vaginal bleeding.  Has questions about GLP1.    Patient's last menstrual period was 06/01/2017.          Sexually active: Yes.    The current method of family planning is post menopausal status.    Exercising: doing rebounding exercise Smoker:  no  Health Maintenance: Pap:  08/30/2020 History of abnormal Pap:  no MMG:  07/21/21.  Has just gotten the reminder to schedule.   Colonoscopy:02/28/2021.  Follow up 10 years BMD:   not indicated Screening Labs: hba1c   reports that she has never smoked. She has never used smokeless tobacco. She reports that she does not drink alcohol and does not use drugs.  Past Medical History:  Diagnosis Date   Anxiety    Depression    Elevated blood pressure    hx   GERD (gastroesophageal reflux disease)    HYPERLIPIDEMIA, MILD    not on medication   MORTON'S NEUROMA, LEFT    Palpitations    sinus tach and PACs on holter and normal echo 02/2010 , no ischemia ETT 05/2011   TESTOSTERONE DEFICIENCY    Vitamin B12 deficiency 12/17/2012   Lab Results Component Value Date  VITAMINB12 250 12/17/2012      Past Surgical History:  Procedure Laterality Date   CESAREAN SECTION  5/91, 6/93, 7/02   TUBAL LIGATION      Current Outpatient Medications  Medication Sig Dispense Refill   atorvastatin (LIPITOR) 10 MG tablet TAKE 1 TABLET(10 MG) BY MOUTH DAILY/Must keep 10/2022 appointment for further refills 30 tablet 1   metoprolol succinate (TOPROL-XL) 25 MG 24 hr tablet TAKE 1 TABLET(25 MG) BY MOUTH DAILY. NEED APPT FOR MORE REFILLS** 30 tablet 0   omeprazole (PRILOSEC) 20 MG capsule Take 1 capsule (20 mg total) by mouth daily. 90 capsule 2   PARoxetine (PAXIL) 10 MG tablet TAKE 1 TABLET(10 MG) BY MOUTH DAILY 90 tablet 4   No current facility-administered medications for this visit.    Family History  Problem Relation Age of Onset   Hypertension Father    Hypertension  Brother     ROS: Constitutional: negative Genitourinary:negative  Exam:   BP 131/78 (BP Location: Right Arm, Patient Position: Sitting, Cuff Size: Large)   Pulse 86   Ht 5' 7.5" (1.715 m)   Wt 222 lb 9.6 oz (101 kg)   LMP 06/01/2017   BMI 34.35 kg/m   Height: 5' 7.5" (171.5 cm)  General appearance: alert, cooperative and appears stated age Head: Normocephalic, without obvious abnormality, atraumatic Neck: no adenopathy, supple, symmetrical, trachea midline and thyroid normal to inspection and palpation Lungs: clear to auscultation bilaterally Breasts: normal appearance, no masses or tenderness Heart: regular rate and rhythm Abdomen: soft, non-tender; bowel sounds normal; no masses,  no organomegaly Extremities: extremities normal, atraumatic, no cyanosis or edema Skin: Skin color, texture, turgor normal. No rashes or lesions Lymph nodes: Cervical, supraclavicular, and axillary nodes normal. No abnormal inguinal nodes palpated Neurologic: Grossly normal   Pelvic: External genitalia:  no lesions              Urethra:  normal appearing urethra with no masses, tenderness or lesions              Bartholins and Skenes: normal                 Vagina: normal appearing vagina with normal color and no  discharge, no lesions              Cervix: no lesions              Pap taken: No. Bimanual Exam:  Uterus:  normal size, contour, position, consistency, mobility, non-tender              Adnexa: normal adnexa and no mass, fullness, tenderness               Rectovaginal: Confirms               Anus:  normal sphincter tone, no lesions  Chaperone, Raechel Ache, RN, was present for exam.  Assessment/Plan: 1. Encounter for well woman exam with routine gynecological exam - Pap smear 2022 - Mammogram 07/2021.  Pt aware this is due.  - Colonoscopy 2023 - Bone mineral density will be done after she turns 59 - lab work:  HbA1C obtained  - vaccines reviewed/updated.  Flu shot given today  2.  Anxiety - PARoxetine (PAXIL) 10 MG tablet; TAKE 1 TABLET(10 MG) BY MOUTH DAILY  Dispense: 90 tablet; Refill: 4  3. Postmenopausal - not on HRT  4. Decreased libido  5. BMI 34.0-34.9,adult - Hemoglobin A1c

## 2022-09-13 ENCOUNTER — Ambulatory Visit (HOSPITAL_BASED_OUTPATIENT_CLINIC_OR_DEPARTMENT_OTHER): Payer: Self-pay | Admitting: Obstetrics & Gynecology

## 2022-09-18 IMAGING — MG MM DIGITAL SCREENING BILAT W/ TOMO AND CAD
8 series · 8 of 24 positions shown · non-contrast
Comparison: Previous exam(s).

CLINICAL DATA: Screening.

EXAM:
DIGITAL SCREENING BILATERAL MAMMOGRAM WITH TOMOSYNTHESIS AND CAD
TECHNIQUE: Bilateral screening digital craniocaudal and mediolateral oblique
mammograms were obtained. Bilateral screening digital breast
tomosynthesis was performed. The images were evaluated with
computer-aided detection.

[R CC synth-2D]
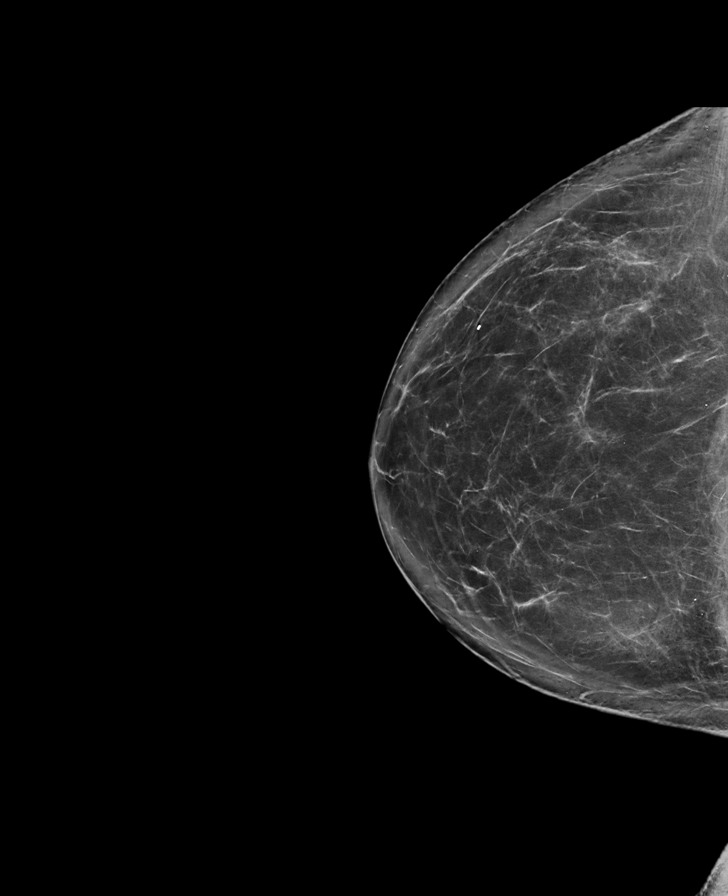

[R MLO synth-2D]
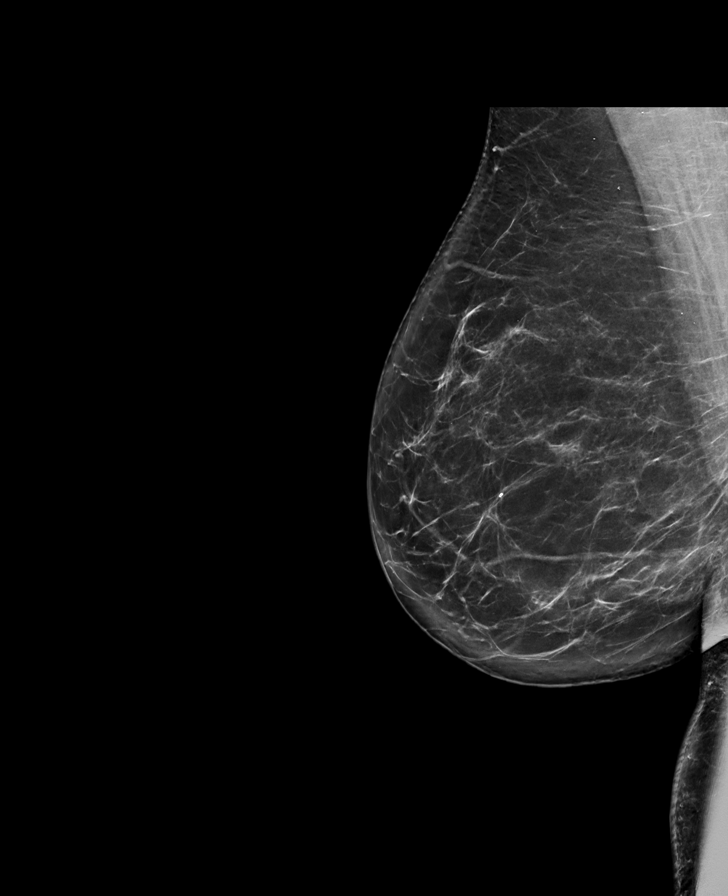

[L CC synth-2D]
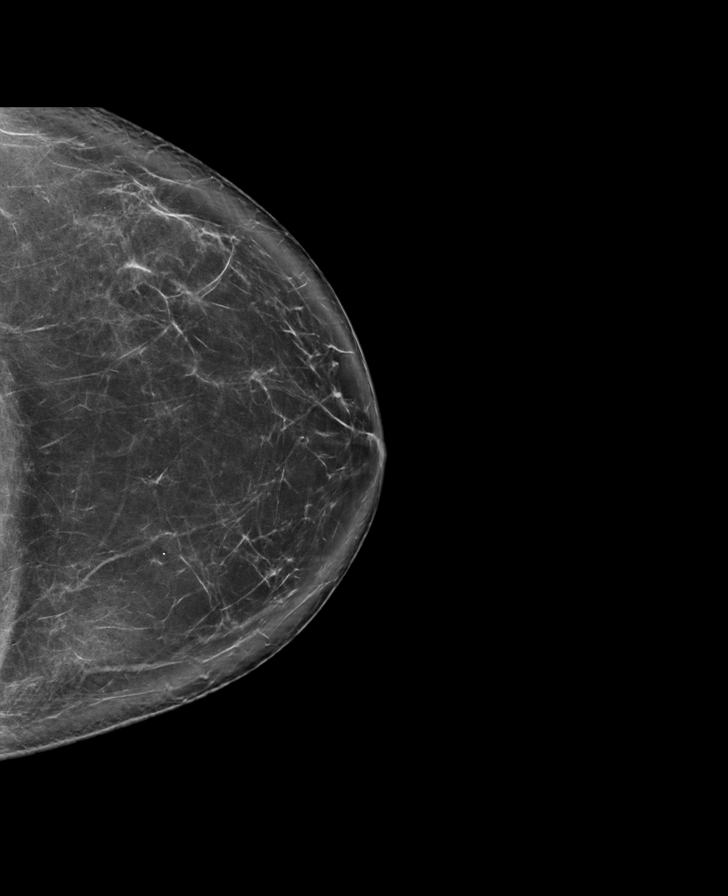

[L MLO synth-2D]
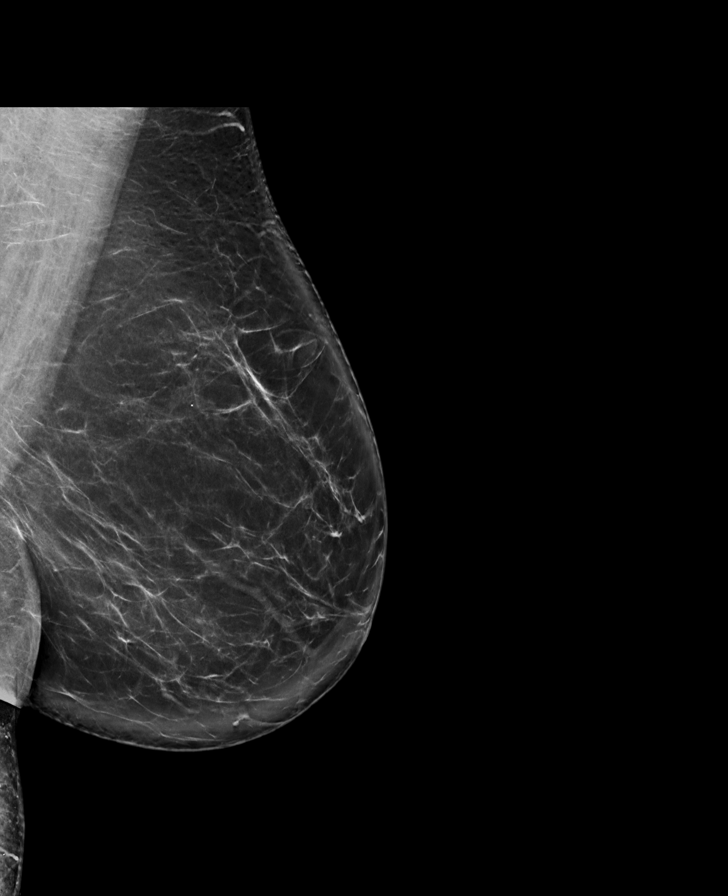

[L MLO tomo · tomo slice 49/97.0]
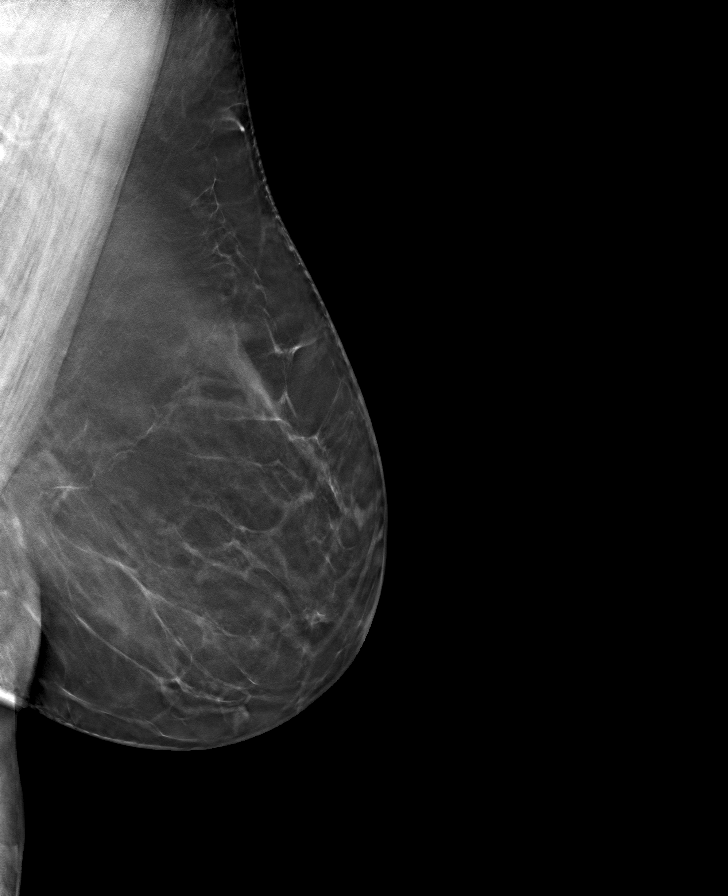

[R MLO tomo · tomo slice 45/89.0]
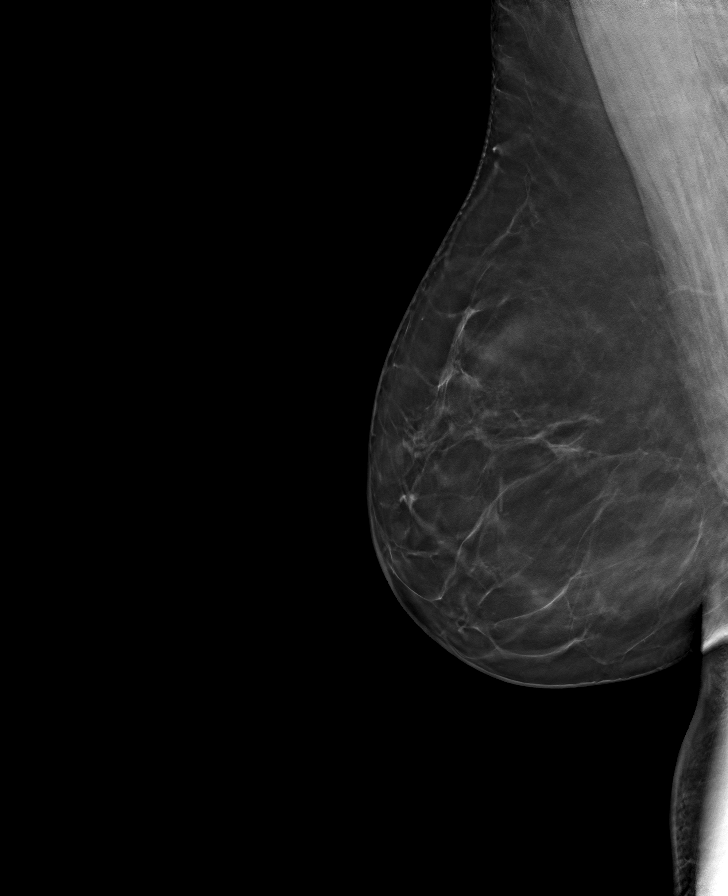

[R CC tomo · tomo slice 44/87.0]
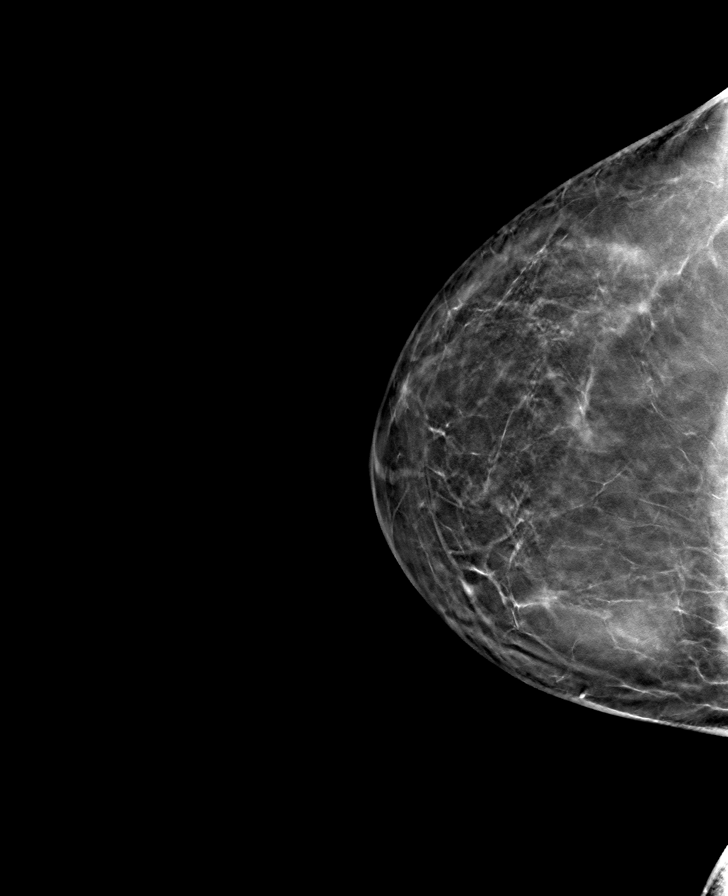

[L CC tomo · tomo slice 47/92.0]
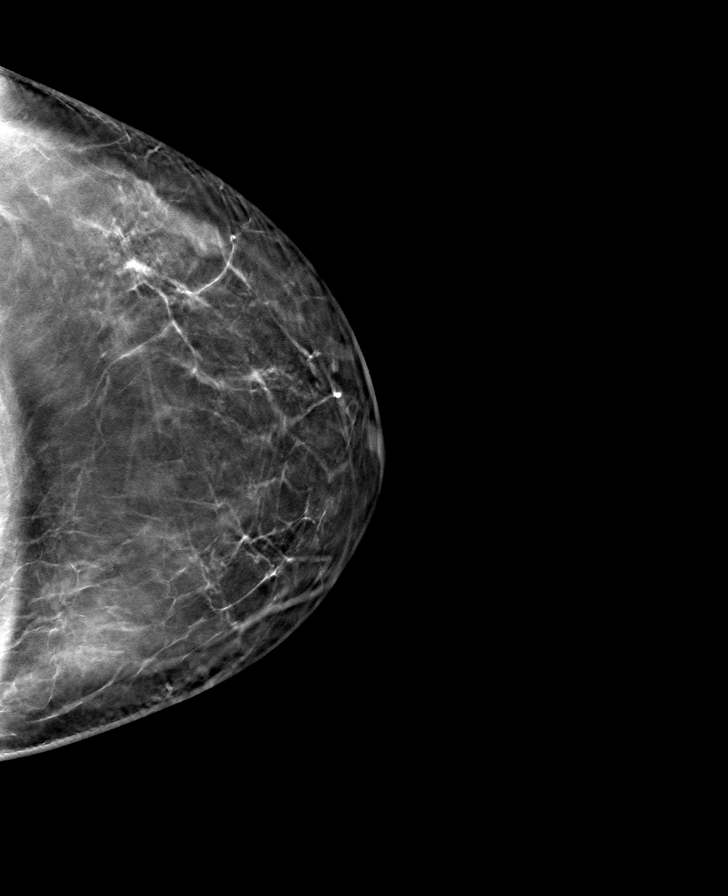

[8 of 24 positions shown; findings below may reference images not displayed]

ACR Breast Density Category b: There are scattered areas of
fibroglandular density.
FINDINGS: There are no findings suspicious for malignancy. The images were
evaluated with computer-aided detection.
IMPRESSION: No mammographic evidence of malignancy. A result letter of this
screening mammogram will be mailed directly to the patient.

RECOMMENDATION:
Screening mammogram in one year. (Code:[id number])

BI-RADS CATEGORY  1: Negative.

## 2022-10-12 ENCOUNTER — Ambulatory Visit: Payer: BC Managed Care – PPO | Admitting: Cardiovascular Disease

## 2022-10-24 ENCOUNTER — Other Ambulatory Visit: Payer: Self-pay | Admitting: Cardiovascular Disease

## 2022-10-24 NOTE — Telephone Encounter (Signed)
10/2022 appt was cancelled with reason Provider (OUT OF OFFICE)  last appt 05/26/21 next appt 01/11/23

## 2022-12-19 ENCOUNTER — Telehealth: Payer: Self-pay | Admitting: Cardiovascular Disease

## 2022-12-19 MED ORDER — METOPROLOL SUCCINATE ER 25 MG PO TB24: ORAL_TABLET | ORAL | 0 refills | Status: DC

## 2022-12-19 NOTE — Telephone Encounter (Signed)
Requested Prescriptions   Signed Prescriptions Disp Refills   metoprolol succinate (TOPROL-XL) 25 MG 24 hr tablet 30 tablet 0    Sig: TAKE 1 TABLET(25 MG) BY MOUTH DAILY. NEED APPT FOR MORE REFILLS**    Authorizing Provider: Lorine Bears A    Ordering User: Guerry Minors

## 2022-12-19 NOTE — Telephone Encounter (Signed)
*  STAT* If patient is at the pharmacy, call can be transferred to refill team.   1. Which medications need to be refilled? (please list name of each medication and dose if known)   metoprolol succinate (TOPROL-XL) 25 MG 24 hr tablet    2. Which pharmacy/location (including street and city if local pharmacy) is medication to be sent to?  [facility] [phone] - [place], [place] - [address]      3. Do they need a 30 day or 90 day supply? 90 day    Pt is out of medication and has upcoming appointment on 01/11/2023 due to provider being out of office on 10/12/22 she had to be r/s her original appt.

## 2023-01-11 ENCOUNTER — Encounter: Payer: Self-pay | Admitting: Cardiovascular Disease

## 2023-01-11 ENCOUNTER — Ambulatory Visit: Payer: BC Managed Care – PPO | Attending: Cardiovascular Disease | Admitting: Cardiovascular Disease

## 2023-01-11 VITALS — BP 106/70 | HR 82 | Ht 67.0 in | Wt 225.5 lb

## 2023-01-11 DIAGNOSIS — E785 Hyperlipidemia, unspecified: Secondary | ICD-10-CM | POA: Diagnosis not present

## 2023-01-11 DIAGNOSIS — I491 Atrial premature depolarization: Secondary | ICD-10-CM | POA: Diagnosis not present

## 2023-01-11 MED ORDER — METOPROLOL SUCCINATE ER 25 MG PO TB24: ORAL_TABLET | ORAL | 3 refills | Status: AC

## 2023-01-11 MED ORDER — ATORVASTATIN CALCIUM 10 MG PO TABS: ORAL_TABLET | ORAL | 3 refills | Status: DC

## 2023-01-11 NOTE — Progress Notes (Signed)
 Cardiology Office Note   Date:  01/11/2023   ID:  Alyssa Pratt, DOB 10-26-1965, MRN [id number]  PCP:  Stephanie Charlene CROME, MD  Cardiologist:   Deatrice Cage, MD   Chief Complaint  Patient presents with   Follow-up    12 month f/u pt would like to discuss CAD due to her brothers having CABG recently.  Meds reviewed verbally with pt.      History of Present Illness: Alyssa Pratt is a 58 y.o. female who presents for a followup visit regarding palpitations due to documented PACs. She had an echocardiogram done in 2012 and 2014 which showed normal LV systolic function without significant structural or valvular abnormalities. She had a Holter monitor done which showed few PACs without any other significant arrhythmia. She had sinus tachycardia with slightly elevated mean average heart rate. Her symptoms have been well controlled on small dose Toprol .    Cardiac CT a in February 2023 showed calcium  score of 0 with no coronary artery disease.    She has been doing well with no chest pain, shortness of breath or palpitations.  Her 2 younger brothers had CABG recently.  Past Medical History:  Diagnosis Date   Anxiety    Depression    Elevated blood pressure    hx   GERD (gastroesophageal reflux disease)    HYPERLIPIDEMIA, MILD    not on medication   MORTON'S NEUROMA, LEFT    Palpitations    sinus tach and PACs on holter and normal echo 02/2010 , no ischemia ETT 05/2011   TESTOSTERONE  DEFICIENCY    Vitamin B12 deficiency 12/17/2012   Lab Results Component Value Date  VITAMINB12 250 12/17/2012      Past Surgical History:  Procedure Laterality Date   CESAREAN SECTION  5/91, 6/93, 7/02   TUBAL LIGATION       Current Outpatient Medications  Medication Sig Dispense Refill   atorvastatin  (LIPITOR) 10 MG tablet TAKE 1 TABLET(10 MG) BY MOUTH DAILY 90 tablet 0   metoprolol  succinate (TOPROL -XL) 25 MG 24 hr tablet TAKE 1 TABLET(25 MG) BY MOUTH DAILY. NEED APPT FOR MORE  REFILLS** 30 tablet 0   omeprazole  (PRILOSEC) 20 MG capsule Take 1 capsule (20 mg total) by mouth daily. 90 capsule 2   PARoxetine  (PAXIL ) 10 MG tablet TAKE 1 TABLET(10 MG) BY MOUTH DAILY 90 tablet 4   No current facility-administered medications for this visit.    Allergies:   Latex    Social History:  The patient  reports that she has never smoked. She has never used smokeless tobacco. She reports that she does not drink alcohol and does not use drugs.   Family History:  The patient's family history includes Heart disease in her brother and brother; Hypertension in her brother and father.    ROS:  Please see the history of present illness.   Otherwise, review of systems are positive for none.   All other systems are reviewed and negative.    PHYSICAL EXAM: VS:  BP 106/70 (BP Location: Left Arm, Patient Position: Sitting, Cuff Size: Large)   Pulse 82   Ht 5' 7 (1.702 m)   Wt 225 lb 8 oz (102.3 kg)   LMP 06/01/2017   SpO2 99%   BMI 35.32 kg/m  , BMI Body mass index is 35.32 kg/m. GEN: Well nourished, well developed, in no acute distress  HEENT: normal  Neck: no JVD, carotid bruits, or masses Cardiac: RRR; no murmurs, rubs, or gallops,no  edema  Respiratory:  clear to auscultation bilaterally, normal work of breathing GI: soft, nontender, nondistended, + BS MS: no deformity or atrophy  Skin: warm and dry, no rash Neuro:  Strength and sensation are intact Psych: euthymic mood, full affect   EKG:  EKG is ordered today. EKG showed: Normal sinus rhythm Normal ECG   Recent Labs: No results found for requested labs within last 365 days.    Lipid Panel    Component Value Date/Time   CHOL 118 05/27/2020 0853   TRIG 84 05/27/2020 0853   HDL 33 (L) 05/27/2020 0853   CHOLHDL 3.6 05/27/2020 0853   CHOLHDL 3.5 09/21/2017 1005   VLDL 15 09/21/2017 1005   LDLCALC 68 05/27/2020 0853      Wt Readings from Last 3 Encounters:  01/11/23 225 lb 8 oz (102.3 kg)  09/12/22 222  lb 9.6 oz (101 kg)  09/07/21 223 lb (101.2 kg)        ASSESSMENT AND PLAN:  1.  Symptomatic PACs: Symptoms are well-controlled with Toprol  and Paxil  for anxiety.  I refilled Toprol    2.  Family history of coronary artery disease: Cardiac CTA in February of 2023 showed normal coronary arteries with calcium  score of 0. I discussed with her the importance of healthy lifestyle changes including diet and exercise.   3.  Mild hyperlipidemia: She continues to take small dose atorvastatin  10 mg daily which was refilled today.  I requested CMP and lipid profile.  Recommended target LDL of less than 70.     Disposition:   FU with me in 1 year  Signed,  Deatrice Cage, MD  01/11/2023 9:18 AM    [facility]

## 2023-01-11 NOTE — Patient Instructions (Signed)
 Medication Instructions:  No changes *If you need a refill on your cardiac medications before your next appointment, please call your pharmacy*   Lab Work: Your provider would like for you to have the following labs today: CBC. CMET, and Lipid  If you have labs (blood work) drawn today and your tests are completely normal, you will receive your results only by: MyChart Message (if you have MyChart) OR A paper copy in the mail If you have any lab test that is abnormal or we need to change your treatment, we will call you to review the results.   Testing/Procedures: None ordered   Follow-Up: At [facility], you and your health needs are our priority.  As part of our continuing mission to provide you with exceptional heart care, we have created designated Provider Care Teams.  These Care Teams include your primary Cardiologist (physician) and Advanced Practice Providers (APPs -  Physician Assistants and Nurse Practitioners) who all work together to provide you with the care you need, when you need it.  We recommend signing up for the patient portal called MyChart.  Sign up information is provided on this After Visit Summary.  MyChart is used to connect with patients for Virtual Visits (Telemedicine).  Patients are able to view lab/test results, encounter notes, upcoming appointments, etc.  Non-urgent messages can be sent to your provider as well.   To learn more about what you can do with MyChart, go to [email or web address].    Your next appointment:   12 month(s)  Provider:   You may see Deatrice Cage, MD or one of the following Advanced Practice Providers on your designated Care Team:   Lonni Meager, NP Bernardino Bring, PA-C Cadence Franchester, PA-C Tylene Lunch, NP Barnie Hila, NP

## 2023-01-12 LAB — COMPREHENSIVE METABOLIC PANEL
ALT: 20 [IU]/L (ref 0–32)
AST: 13 [IU]/L (ref 0–40)
Albumin: 4.6 g/dL (ref 3.8–4.9)
Alkaline Phosphatase: 110 [IU]/L (ref 44–121)
BUN/Creatinine Ratio: 18 (ref 9–23)
BUN: 17 mg/dL (ref 6–24)
Bilirubin Total: 0.5 mg/dL (ref 0.0–1.2)
CO2: 23 mmol/L (ref 20–29)
Calcium: 10 mg/dL (ref 8.7–10.2)
Chloride: 104 mmol/L (ref 96–106)
Creatinine, Ser: 0.94 mg/dL (ref 0.57–1.00)
Globulin, Total: 2 g/dL (ref 1.5–4.5)
Glucose: 76 mg/dL (ref 70–99)
Potassium: 4.6 mmol/L (ref 3.5–5.2)
Sodium: 143 mmol/L (ref 134–144)
Total Protein: 6.6 g/dL (ref 6.0–8.5)
eGFR: 70 mL/min/{1.73_m2} (ref >59)

## 2023-01-12 LAB — CBC
Hematocrit: 42.7 % (ref 34.0–46.6)
Hemoglobin: 14.1 g/dL (ref 11.1–15.9)
MCH: 30.6 pg (ref 26.6–33.0)
MCHC: 33 g/dL (ref 31.5–35.7)
MCV: 93 fL (ref 79–97)
Platelets: 267 10*3/uL (ref 150–450)
RBC: 4.61 x10E6/uL (ref 3.77–5.28)
RDW: 12.6 % (ref 11.7–15.4)
WBC: 6.5 10*3/uL (ref 3.4–10.8)

## 2023-01-12 LAB — LIPID PANEL
Chol/HDL Ratio: 3.1 {ratio} (ref 0.0–4.4)
Cholesterol, Total: 153 mg/dL (ref 100–199)
HDL: 49 mg/dL (ref >39)
LDL Chol Calc (NIH): 80 mg/dL (ref 0–99)
Triglycerides: 139 mg/dL (ref 0–149)
VLDL Cholesterol Cal: 24 mg/dL (ref 5–40)

## 2023-01-16 ENCOUNTER — Encounter: Payer: Self-pay | Admitting: *Deleted

## 2023-06-22 ENCOUNTER — Encounter (HOSPITAL_BASED_OUTPATIENT_CLINIC_OR_DEPARTMENT_OTHER): Payer: Self-pay | Admitting: Obstetrics & Gynecology

## 2023-07-02 ENCOUNTER — Encounter: Payer: Self-pay | Admitting: Cardiovascular Disease

## 2023-07-02 ENCOUNTER — Telehealth: Payer: Self-pay | Admitting: Cardiovascular Disease

## 2023-07-02 DIAGNOSIS — I491 Atrial premature depolarization: Secondary | ICD-10-CM

## 2023-07-02 NOTE — Telephone Encounter (Signed)
 The patient has been called and reports feeling fluttery and then used her Kardia device to do a EKG and has sent that in her MyChart.  Pt reports taking her Toprol  daily as prescribed.  Pt hasn't collected any BPs during the afib episodes.  Pt also advised the MyChart message with be forwarded

## 2023-07-02 NOTE — Telephone Encounter (Signed)
 Patient c/o Palpitations:  STAT if patient reporting lightheadedness, shortness of breath, or chest pain  How long have you had palpitations/irregular HR/ Afib? Afib this morning for about 3-4 min Are you having the symptoms now? No  Are you currently experiencing lightheadedness, SOB or CP? No  Do you have a history of afib (atrial fibrillation) or irregular heart rhythm? Both  Have you checked your BP or HR? (document readings if available): low 90's  Are you experiencing any other symptoms? No

## 2023-07-04 ENCOUNTER — Ambulatory Visit: Attending: Cardiovascular Disease

## 2023-07-04 DIAGNOSIS — I491 Atrial premature depolarization: Secondary | ICD-10-CM

## 2023-07-04 NOTE — Telephone Encounter (Signed)
 See MyChart message

## 2023-07-10 ENCOUNTER — Telehealth: Payer: Self-pay | Admitting: Cardiovascular Disease

## 2023-07-10 NOTE — Telephone Encounter (Signed)
 Left voicemail, pt needs to have 4-6 week follow up after zio monitor scheduled per Olivia, RN on 7/2. Thank you

## 2023-07-26 DIAGNOSIS — I491 Atrial premature depolarization: Secondary | ICD-10-CM | POA: Diagnosis not present

## 2023-08-09 ENCOUNTER — Ambulatory Visit: Payer: Self-pay | Admitting: Cardiovascular Disease

## 2023-08-09 DIAGNOSIS — I491 Atrial premature depolarization: Secondary | ICD-10-CM

## 2023-08-13 ENCOUNTER — Encounter: Payer: Self-pay | Admitting: *Deleted

## 2023-08-20 ENCOUNTER — Ambulatory Visit: Attending: Cardiology | Admitting: Physician Assistant

## 2023-08-20 ENCOUNTER — Encounter (HOSPITAL_BASED_OUTPATIENT_CLINIC_OR_DEPARTMENT_OTHER): Payer: Self-pay | Admitting: Obstetrics & Gynecology

## 2023-08-20 ENCOUNTER — Encounter: Payer: Self-pay | Admitting: Cardiology

## 2023-08-20 VITALS — BP 120/80 | HR 74 | Ht 67.0 in | Wt 229.8 lb

## 2023-08-20 DIAGNOSIS — E785 Hyperlipidemia, unspecified: Secondary | ICD-10-CM | POA: Diagnosis not present

## 2023-08-20 DIAGNOSIS — R002 Palpitations: Secondary | ICD-10-CM

## 2023-08-20 DIAGNOSIS — I491 Atrial premature depolarization: Secondary | ICD-10-CM

## 2023-08-20 NOTE — Patient Instructions (Signed)
 Medication Instructions:  Your physician recommends that you continue on your current medications as directed. Please refer to the Current Medication list given to you today.   *If you need a refill on your cardiac medications before your next appointment, please call your pharmacy*  Lab Work: No labs ordered today  If you have labs (blood work) drawn today and your tests are completely normal, you will receive your results only by: MyChart Message (if you have MyChart) OR A paper copy in the mail If you have any lab test that is abnormal or we need to change your treatment, we will call you to review the results.  Testing/Procedures: No test ordered today   Follow-Up: At [facility], you and your health needs are our priority.  As part of our continuing mission to provide you with exceptional heart care, our providers are all part of one team.  This team includes your primary Cardiologist (physician) and Advanced Practice Providers or APPs (Physician Assistants and Nurse Practitioners) who all work together to provide you with the care you need, when you need it.  Your next appointment:   12 month(s)  Provider:   Deatrice Cage, MD or Lesley Maffucci, PA-C

## 2023-08-20 NOTE — Progress Notes (Signed)
 Cardiology Office Note    Date:  08/20/2023   ID:  Alyssa Pratt, DOB 10/09/1965, MRN [id number]  PCP:  Stephanie Charlene CROME, MD  Cardiologist:  Deatrice Cage, MD  Electrophysiologist:  None   Chief Complaint: Follow up  History of Present Illness:   Alyssa Pratt is a 58 y.o. female with history of anxiety, depression, GERD, hyperlipidemia, and palpitations who present for follow up on long term monitor results.     Patient previously had echocardiogram in 2012 and 2014 which both showed normal LV systolic function without significant structural or valvular abnormalities.  Holter monitor showed few PACs without any other significant arrhythmia.  She had sinus tachycardia with slightly elevated mean average heart rate.  Her symptoms have been managed with small dose Toprol .  Coronary CTA in 02/2021 showed a calcium  score of 0 with no coronary artery disease.   Patient recently called our office with concerns for symptoms of heart fluttering and dizziness.  Long-term monitor was ordered and showed predominantly sinus rhythm with 2 runs of SVT lasting up to 20 beats.  Rare PACs and PVCs were noted.  No evidence of atrial fibrillation or other arrhythmia.  Patient presents today doing overall well from a cardiac perspective.  She reports symptoms of palpitations have largely been controlled on her low-dose of Toprol .  She has relieved that her heart monitor did not show any significant arrhythmia.  She reports that this has reduced her stress level significantly.  She is without symptoms of angina and cardiac decompensation.  She denies chest pain, shortness of breath, lightheadedness, dizziness, and lower extremity swelling.  Labs independently reviewed: 01/11/2023-Hgb 14.1, HCT 42.7, platelets 267, BUN 17, creatinine 0.94, potassium 4.6, sodium 143, normal LFTs, TC 153, TG 139, HDL 49, LDL 80  Objective   Past Medical History:  Diagnosis Date   Anxiety    Depression    Elevated  blood pressure    hx   GERD (gastroesophageal reflux disease)    HYPERLIPIDEMIA, MILD    not on medication   MORTON'S NEUROMA, LEFT    Palpitations    sinus tach and PACs on holter and normal echo 02/2010 , no ischemia ETT 05/2011   TESTOSTERONE  DEFICIENCY    Vitamin B12 deficiency 12/17/2012   Lab Results Component Value Date  VITAMINB12 250 12/17/2012      Current Medications: Current Meds  Medication Sig   atorvastatin  (LIPITOR) 10 MG tablet TAKE 1 TABLET(10 MG) BY MOUTH DAILY   metoprolol  succinate (TOPROL -XL) 25 MG 24 hr tablet TAKE 1 TABLET(25 MG) BY MOUTH DAILY.   omeprazole  (PRILOSEC) 20 MG capsule Take 1 capsule (20 mg total) by mouth daily.   PARoxetine  (PAXIL ) 10 MG tablet TAKE 1 TABLET(10 MG) BY MOUTH DAILY    Allergies:   Latex   Social History   Socioeconomic History   Marital status: Married    Spouse name: Not on file   Number of children: 3   Years of education: Not on file   Highest education level: Not on file  Occupational History   Occupation: Environmental health practitioner  Tobacco Use   Smoking status: Never   Smokeless tobacco: Never   Tobacco comments:    Married, lives with spouse. works part time as Marine scientist status: Never Used  Substance and Sexual Activity   Alcohol use: No    Comment: Denies alcohol use.    Drug use: No   Sexual activity: Yes  Partners: Male    Birth control/protection: Surgical    Comment: BTL  Other Topics Concern   Not on file  Social History Narrative   Not on file   Social Drivers of Health   Financial Resource Strain: Not on file  Food Insecurity: Not on file  Transportation Needs: Not on file  Physical Activity: Not on file  Stress: Not on file  Social Connections: Not on file     Family History:  The patient's family history includes Heart disease in her brother and brother; Hypertension in her brother and father.  ROS:   12-point review of systems is negative unless otherwise  noted in the HPI.  EKGs/Other Studies Reviewed:    Studies reviewed were summarized above. The additional studies were reviewed today:  07/27/2023 Long term monitor Patient had a min HR of 55 bpm, max HR of 176 bpm, and avg HR of 83 bpm. Predominant underlying rhythm was Sinus Rhythm.  2 runs of Supraventricular Tachycardia occurred the longest lasted 20 beats with a max rate of 176 bpm (avg 155 bpm). Supraventricular Tachycardia was detected within +/- 45 seconds of symptomatic patient event(s).  Many triggered events did not correlate with arrhythmia. Rare PACs and rare PVCs. No evidence of atrial fibrillation.  02/03/2021 Coronary CTA 1. Normal coronary calcium  score of 0. Patient is low risk for coronary events. 2. Normal coronary origin with right dominance. 3. No evidence of CAD. 4. CAD-RADS 0. Consider non-atherosclerotic causes of chest pain.  EKG:  EKG personally reviewed by me today EKG Interpretation Date/Time:  Monday August 20 2023 08:05:45 EDT Ventricular Rate:  74 PR Interval:  136 QRS Duration:  78 QT Interval:  374 QTC Calculation: 415 R Axis:   2  Text Interpretation: Normal sinus rhythm Normal ECG When compared with ECG of 11-Jan-2023 09:01, No significant change was found Confirmed by Lorene Sinclair ([id number]) on 08/20/2023 8:07:54 AM  PHYSICAL EXAM:    VS:  BP 120/80   Pulse 74   Ht 5' 7 (1.702 m)   Wt 229 lb 12.8 oz (104.2 kg)   LMP 06/01/2017   SpO2 97%   BMI 35.99 kg/m   BMI: Body mass index is 35.99 kg/m.  Physical Exam Vitals and nursing note reviewed.  Constitutional:      General: She is not in acute distress.    Appearance: Normal appearance.  Cardiovascular:     Rate and Rhythm: Normal rate and regular rhythm.     Heart sounds: No murmur heard. Pulmonary:     Effort: Pulmonary effort is normal. No respiratory distress.     Breath sounds: No wheezing or rales.  Musculoskeletal:     Right lower leg: No edema.     Left lower leg: No edema.   Skin:    General: Skin is warm and dry.  Neurological:     General: No focal deficit present.     Mental Status: She is alert and oriented to person, place, and time. Mental status is at baseline.  Psychiatric:        Mood and Affect: Mood normal.        Behavior: Behavior normal.    Wt Readings from Last 3 Encounters:  08/20/23 229 lb 12.8 oz (104.2 kg)  01/11/23 225 lb 8 oz (102.3 kg)  09/12/22 222 lb 9.6 oz (101 kg)       ASSESSMENT & PLAN:   Symptomatic PACs Palpitations - Long-term monitor 07/2023 showed predominantly sinus rhythm with 2 runs  of SVT up to 20 beats and rare PACs/PVCs.  No evidence of atrial fibrillation or other arrhythmia.  Patient's symptoms are largely controlled on metoprolol  XL 25 mg daily.  Mild hyperlipidemia - Most recent lipid panel with LDL of 80, at goal < 100.  She is continued on atorvastatin  10 mg daily.    Disposition: F/u with Dr. Darron or an APP in 1 year.   Medication Adjustments/Labs and Tests Ordered: Current medicines are reviewed at length with the patient today.  Concerns regarding medicines are outlined above. Medication changes, Labs and Tests ordered today are summarized above and listed in the Patient Instructions accessible in Encounters.   Bonney Lesley Maffucci, PA-C 08/20/2023 8:24 AM     [facility] [place] [address] [place], [place] [address] [phone]

## 2023-09-25 ENCOUNTER — Ambulatory Visit (HOSPITAL_BASED_OUTPATIENT_CLINIC_OR_DEPARTMENT_OTHER): Admitting: Obstetrics & Gynecology

## 2023-09-25 ENCOUNTER — Other Ambulatory Visit (HOSPITAL_COMMUNITY)
Admission: RE | Admit: 2023-09-25 | Discharge: 2023-09-25 | Disposition: A | Source: Ambulatory Visit | Attending: Obstetrics & Gynecology | Admitting: Obstetrics & Gynecology

## 2023-09-25 ENCOUNTER — Encounter (HOSPITAL_BASED_OUTPATIENT_CLINIC_OR_DEPARTMENT_OTHER): Payer: Self-pay | Admitting: Obstetrics & Gynecology

## 2023-09-25 VITALS — BP 126/85 | HR 75 | Ht 67.0 in | Wt 229.0 lb

## 2023-09-25 DIAGNOSIS — Z8659 Personal history of other mental and behavioral disorders: Secondary | ICD-10-CM | POA: Diagnosis not present

## 2023-09-25 DIAGNOSIS — Z124 Encounter for screening for malignant neoplasm of cervix: Secondary | ICD-10-CM | POA: Insufficient documentation

## 2023-09-25 DIAGNOSIS — Z79890 Hormone replacement therapy (postmenopausal): Secondary | ICD-10-CM | POA: Diagnosis not present

## 2023-09-25 DIAGNOSIS — Z01419 Encounter for gynecological examination (general) (routine) without abnormal findings: Secondary | ICD-10-CM

## 2023-09-25 DIAGNOSIS — Z23 Encounter for immunization: Secondary | ICD-10-CM | POA: Diagnosis not present

## 2023-09-25 DIAGNOSIS — Z1151 Encounter for screening for human papillomavirus (HPV): Secondary | ICD-10-CM | POA: Diagnosis not present

## 2023-09-25 DIAGNOSIS — Z1231 Encounter for screening mammogram for malignant neoplasm of breast: Secondary | ICD-10-CM

## 2023-09-25 MED ORDER — PROGESTERONE MICRONIZED 100 MG PO CAPS: 100.0000 mg | ORAL_CAPSULE | Freq: Every day | ORAL | 2 refills | Status: DC

## 2023-09-25 MED ORDER — ESTRADIOL 0.05 MG/24HR TD PTTW: 1.0000 | MEDICATED_PATCH | TRANSDERMAL | 2 refills | Status: DC

## 2023-09-25 NOTE — Progress Notes (Signed)
 ANNUAL EXAM Patient name: Alyssa Pratt MRN [id number]  Date of birth: [birth year] Chief Complaint:   Annual Exam  History of Present Illness:   Alyssa Pratt is a 58 y.o. G3P3 Caucasian female being seen today for a routine annual exam.   Has complaints of increased menopausal symptoms.    Patient's last menstrual period was 06/01/2017.   Last pap 09/02/2020. Results were: NILM w/ HRHPV negative. H/O abnormal pap: no Last mammogram: 07/21/2021. Results were: normal. Family h/o breast cancer: no Last colonoscopy: 02/28/2021 with Dr. Kristie. Results were: normal. Family h/o colorectal cancer: no Dexa:  discussed screening guidelines     09/25/2023    9:24 AM 09/12/2022   10:19 AM 09/07/2021    8:58 AM 08/30/2020    4:34 PM  Depression screen PHQ 2/9  Decreased Interest 0 0 0 0  Down, Depressed, Hopeless 0 0 0 0  PHQ - 2 Score 0 0 0 0    Review of Systems:   Pertinent items are noted in HPI Denies any bowel changes.  Denies pelvic pain.  Having some mild incontinence.   Pertinent History Reviewed:  Reviewed past medical,surgical, social and family history.  Reviewed problem list, medications and allergies. Physical Assessment:   Vitals:   09/25/23 0921  BP: 126/85  Pulse: 75  SpO2: 100%  Weight: 229 lb (103.9 kg)  Height: 5' 7 (1.702 m)  Body mass index is 35.87 kg/m.        Physical Examination:   General appearance - well appearing, and in no distress  Mental status - alert, oriented to person, place, and time  Psych:  She has a normal mood and affect  Skin - warm and dry, normal color, no suspicious lesions noted  Chest - effort normal, all lung fields clear to auscultation bilaterally  Heart - normal rate and regular rhythm  Neck:  midline trachea, no thyromegaly or nodules  Breasts - breasts appear normal, no suspicious masses, no skin or nipple changes or  axillary nodes  Abdomen - soft, nontender, nondistended, no masses or organomegaly  Pelvic -  VULVA: normal appearing vulva with no masses, tenderness or lesions   VAGINA: normal appearing vagina with normal color and discharge, no lesions   CERVIX: normal appearing cervix without discharge or lesions, no CMT  Thin prep pap is and HR HPV cotesting  UTERUS: uterus is felt to be normal size, shape, consistency and nontender   ADNEXA: No adnexal masses or tenderness noted.  Rectal - normal rectal, good sphincter tone, no masses felt.   Extremities:  No swelling or varicosities noted  Chaperone present for exam  No results found for this or any previous visit (from the past 24 hours).  Assessment & Plan:  1. Encounter for well woman exam with routine gynecological exam (Primary) - Pap smear updated today - Mammogram is due.  Order placed. - Colonoscopy 2023 with Dr. Kristie - Bone mineral density guidelines reviewed - lab work done with PCP - vaccines reviewed/updated.  Influenza vaccination given.    2. Encounter for screening mammogram for malignant neoplasm of breast - MM 3D SCREENING MAMMOGRAM BILATERAL BREAST; Future  3. Hormone replacement therapy (HRT) - estradiol  (VIVELLE -DOT) 0.05 MG/24HR patch; Place 1 patch (0.05 mg total) onto the skin 2 (two) times a week.  Dispense: 8 patch; Refill: 2 - progesterone  (PROMETRIUM ) 100 MG capsule; Take 1 capsule (100 mg total) by mouth daily.  Dispense: 30 capsule; Refill: 2  4. Cervical cancer screening -  Cytology - PAP( [facility])  5. History of depression - does not need paxil  RF at this time.  She will call when she needs it.     Orders Placed This Encounter  Procedures   MM 3D SCREENING MAMMOGRAM BILATERAL BREAST    Meds:  Meds ordered this encounter  Medications   estradiol  (VIVELLE -DOT) 0.05 MG/24HR patch    Sig: Place 1 patch (0.05 mg total) onto the skin 2 (two) times a week.    Dispense:  8 patch    Refill:  2   progesterone  (PROMETRIUM ) 100 MG capsule    Sig: Take 1 capsule (100 mg total) by mouth daily.     Dispense:  30 capsule    Refill:  2    Follow-up: Return in about 2 months (around 11/25/2023).  Ronal GORMAN Pinal, MD 09/25/2023 10:20 AM

## 2023-09-26 LAB — CYTOLOGY - PAP
Adequacy: ABSENT
Comment: NEGATIVE
Diagnosis: NEGATIVE
Diagnosis: REACTIVE
High risk HPV: NEGATIVE

## 2023-09-27 ENCOUNTER — Ambulatory Visit (HOSPITAL_BASED_OUTPATIENT_CLINIC_OR_DEPARTMENT_OTHER): Payer: Self-pay | Admitting: Obstetrics & Gynecology

## 2023-10-09 ENCOUNTER — Ambulatory Visit
Admission: RE | Admit: 2023-10-09 | Discharge: 2023-10-09 | Disposition: A | Source: Ambulatory Visit | Attending: Obstetrics & Gynecology | Admitting: Obstetrics & Gynecology

## 2023-10-09 DIAGNOSIS — Z1231 Encounter for screening mammogram for malignant neoplasm of breast: Secondary | ICD-10-CM

## 2023-10-30 ENCOUNTER — Encounter (HOSPITAL_BASED_OUTPATIENT_CLINIC_OR_DEPARTMENT_OTHER): Payer: Self-pay | Admitting: Obstetrics & Gynecology

## 2023-10-30 ENCOUNTER — Other Ambulatory Visit (HOSPITAL_BASED_OUTPATIENT_CLINIC_OR_DEPARTMENT_OTHER): Payer: Self-pay

## 2023-10-30 DIAGNOSIS — F419 Anxiety disorder, unspecified: Secondary | ICD-10-CM

## 2023-10-30 MED ORDER — PAROXETINE HCL 10 MG PO TABS
ORAL_TABLET | ORAL | 4 refills | Status: AC
Start: 2023-10-30 — End: ?

## 2023-12-03 ENCOUNTER — Encounter (HOSPITAL_BASED_OUTPATIENT_CLINIC_OR_DEPARTMENT_OTHER): Payer: Self-pay | Admitting: Obstetrics & Gynecology

## 2023-12-03 ENCOUNTER — Telehealth (HOSPITAL_BASED_OUTPATIENT_CLINIC_OR_DEPARTMENT_OTHER): Admitting: Obstetrics & Gynecology

## 2023-12-03 DIAGNOSIS — Z79890 Hormone replacement therapy (postmenopausal): Secondary | ICD-10-CM

## 2023-12-03 DIAGNOSIS — L659 Nonscarring hair loss, unspecified: Secondary | ICD-10-CM

## 2023-12-03 NOTE — Progress Notes (Signed)
 Virtual Visit via Video Note  I connected with Alyssa Pratt on 12/03/23 at 10:15 AM EST by a video enabled telemedicine application and verified that I am speaking with the correct person using two identifiers.  Ongoing vaginal dryness, low libido, hairloss. No symptom relief but no side effects from the medication.  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 The patient expressed understanding and agreed to proceed.  History of Present Illness: 58 yo G3P3 MWF with virtual visit after starting HRT.  She is on estradiol  transdermal and oral progesterone .  She isn't having any breast tenderness.  Denies vaginal bleeding.  But hasn't really felt much change or improvement.  She is having hair thinning which is bothersome as well.  No vaginal symptom changes either.  She does have some questions about thyroid  testing.  Has not had thyroid  testing since 09/2021 but would Pratt to have this done.  Also, discussed testing estradiol  testing as well before making any decisions about increasing patch or stopping this.  Questions answered.   Observations/Objective: WNWD WF  Assessment and Plan: 1. Hair loss (Primary) - TSH; Future - T4, free; Future - orders placed so she could have this done at [facility].  She knows how to make an appt.  2. Hormone replacement therapy (HRT) - Estradiol ; Future   Follow Up Instructions: I discussed the assessment and treatment plan with the patient. The patient was provided an opportunity to ask questions and all were answered. The patient agreed with the plan and demonstrated an understanding of the instructions.   The patient was advised to call back or seek an in-person evaluation if the symptoms worsen or if the condition fails to improve as anticipated.  I provided 12 minutes of non-face-to-face time during this  encounter.   Sprague, Kaitlyn E, RN

## 2023-12-04 LAB — T4, FREE: Free T4: 1.15 ng/dL (ref 0.82–1.77)

## 2023-12-04 LAB — ESTRADIOL: Estradiol: 35 pg/mL

## 2023-12-04 LAB — TSH: TSH: 3.63 u[IU]/mL (ref 0.450–4.500)

## 2023-12-10 ENCOUNTER — Encounter (HOSPITAL_BASED_OUTPATIENT_CLINIC_OR_DEPARTMENT_OTHER): Payer: Self-pay | Admitting: Obstetrics & Gynecology

## 2023-12-14 ENCOUNTER — Other Ambulatory Visit (HOSPITAL_BASED_OUTPATIENT_CLINIC_OR_DEPARTMENT_OTHER): Payer: Self-pay | Admitting: Obstetrics & Gynecology

## 2023-12-14 DIAGNOSIS — Z79890 Hormone replacement therapy (postmenopausal): Secondary | ICD-10-CM

## 2023-12-16 ENCOUNTER — Other Ambulatory Visit (HOSPITAL_BASED_OUTPATIENT_CLINIC_OR_DEPARTMENT_OTHER): Payer: Self-pay | Admitting: Obstetrics & Gynecology

## 2023-12-16 DIAGNOSIS — Z79890 Hormone replacement therapy (postmenopausal): Secondary | ICD-10-CM

## 2023-12-17 ENCOUNTER — Other Ambulatory Visit (HOSPITAL_BASED_OUTPATIENT_CLINIC_OR_DEPARTMENT_OTHER): Payer: Self-pay

## 2023-12-17 ENCOUNTER — Ambulatory Visit (HOSPITAL_BASED_OUTPATIENT_CLINIC_OR_DEPARTMENT_OTHER): Payer: Self-pay | Admitting: Obstetrics & Gynecology

## 2023-12-17 ENCOUNTER — Other Ambulatory Visit (HOSPITAL_BASED_OUTPATIENT_CLINIC_OR_DEPARTMENT_OTHER): Payer: Self-pay | Admitting: Obstetrics & Gynecology

## 2023-12-17 DIAGNOSIS — Z79890 Hormone replacement therapy (postmenopausal): Secondary | ICD-10-CM

## 2023-12-17 MED ORDER — ESTRADIOL 0.075 MG/24HR TD PTWK: 0.0750 mg | MEDICATED_PATCH | TRANSDERMAL | 0 refills | Status: DC

## 2023-12-17 MED ORDER — PROGESTERONE 200 MG PO CAPS: 200.0000 mg | ORAL_CAPSULE | Freq: Every day | ORAL | 1 refills | Status: AC

## 2024-01-06 ENCOUNTER — Encounter (HOSPITAL_BASED_OUTPATIENT_CLINIC_OR_DEPARTMENT_OTHER): Payer: Self-pay | Admitting: Obstetrics & Gynecology

## 2024-01-11 ENCOUNTER — Other Ambulatory Visit (HOSPITAL_BASED_OUTPATIENT_CLINIC_OR_DEPARTMENT_OTHER): Payer: Self-pay

## 2024-01-11 DIAGNOSIS — Z79890 Hormone replacement therapy (postmenopausal): Secondary | ICD-10-CM

## 2024-01-11 MED ORDER — ESTRADIOL 0.075 MG/24HR TD PTWK: 0.0750 mg | MEDICATED_PATCH | TRANSDERMAL | 0 refills | Status: DC

## 2024-01-11 NOTE — Telephone Encounter (Signed)
 Patient will receive additional refills at next office visit. 02/2024

## 2024-01-17 ENCOUNTER — Other Ambulatory Visit: Payer: Self-pay | Admitting: Cardiovascular Disease

## 2024-01-17 NOTE — Telephone Encounter (Signed)
 In accordance with refill protocols, please review and address the following requirements before this medication refill can be authorized:  Labs  -Lipid panel Last done 01/11/23

## 2024-02-01 ENCOUNTER — Encounter (HOSPITAL_BASED_OUTPATIENT_CLINIC_OR_DEPARTMENT_OTHER): Payer: Self-pay | Admitting: Obstetrics & Gynecology

## 2024-02-01 ENCOUNTER — Other Ambulatory Visit (HOSPITAL_BASED_OUTPATIENT_CLINIC_OR_DEPARTMENT_OTHER): Payer: Self-pay

## 2024-02-01 DIAGNOSIS — Z79890 Hormone replacement therapy (postmenopausal): Secondary | ICD-10-CM

## 2024-02-01 MED ORDER — ESTRADIOL 0.075 MG/24HR TD PTWK: 0.0750 mg | MEDICATED_PATCH | TRANSDERMAL | 0 refills | Status: AC

## 2024-02-25 ENCOUNTER — Telehealth (HOSPITAL_BASED_OUTPATIENT_CLINIC_OR_DEPARTMENT_OTHER): Payer: Self-pay | Admitting: Obstetrics & Gynecology
